# Patient Record
Sex: Male | Born: 1940 | Race: White | Hispanic: No | Marital: Married | Smoking: Former smoker
Health system: Southern US, Community
[De-identification: ages and names within clinical notes are randomized; demographics above are authoritative.]

## PROBLEM LIST (undated history)

## (undated) DIAGNOSIS — R0602 Shortness of breath: Secondary | ICD-10-CM

## (undated) DIAGNOSIS — K219 Gastro-esophageal reflux disease without esophagitis: Secondary | ICD-10-CM

## (undated) DIAGNOSIS — N289 Disorder of kidney and ureter, unspecified: Secondary | ICD-10-CM

## (undated) DIAGNOSIS — G4730 Sleep apnea, unspecified: Secondary | ICD-10-CM

## (undated) DIAGNOSIS — E7800 Pure hypercholesterolemia, unspecified: Secondary | ICD-10-CM

## (undated) DIAGNOSIS — R011 Cardiac murmur, unspecified: Secondary | ICD-10-CM

## (undated) DIAGNOSIS — J189 Pneumonia, unspecified organism: Secondary | ICD-10-CM

## (undated) DIAGNOSIS — I10 Essential (primary) hypertension: Secondary | ICD-10-CM

## (undated) DIAGNOSIS — I509 Heart failure, unspecified: Secondary | ICD-10-CM

## (undated) DIAGNOSIS — M109 Gout, unspecified: Secondary | ICD-10-CM

## (undated) DIAGNOSIS — E119 Type 2 diabetes mellitus without complications: Secondary | ICD-10-CM

## (undated) HISTORY — PX: NEPHRECTOMY TRANSPLANTED ORGAN: SUR880

---

## 1989-05-03 HISTORY — PX: SHOULDER OPEN ROTATOR CUFF REPAIR: SHX2407

## 1989-05-03 HISTORY — PX: SHOULDER ARTHROSCOPY W/ ROTATOR CUFF REPAIR: SHX2400

## 2006-02-25 ENCOUNTER — Ambulatory Visit: Payer: Self-pay | Admitting: Internal Medicine

## 2006-03-27 ENCOUNTER — Ambulatory Visit: Payer: Self-pay | Admitting: Internal Medicine

## 2006-04-03 ENCOUNTER — Ambulatory Visit: Payer: Self-pay | Admitting: Internal Medicine

## 2009-03-08 ENCOUNTER — Encounter (INDEPENDENT_AMBULATORY_CARE_PROVIDER_SITE_OTHER): Payer: Self-pay | Admitting: *Deleted

## 2010-03-02 HISTORY — PX: AV FISTULA PLACEMENT: SHX1204

## 2010-05-14 HISTORY — PX: KIDNEY TRANSPLANT: SHX239

## 2011-09-03 DIAGNOSIS — J189 Pneumonia, unspecified organism: Secondary | ICD-10-CM

## 2011-09-03 HISTORY — DX: Pneumonia, unspecified organism: J18.9

## 2012-09-02 HISTORY — PX: CATARACT EXTRACTION W/ INTRAOCULAR LENS  IMPLANT, BILATERAL: SHX1307

## 2013-05-16 ENCOUNTER — Emergency Department (HOSPITAL_COMMUNITY): Payer: Medicare Other

## 2013-05-16 ENCOUNTER — Inpatient Hospital Stay (HOSPITAL_COMMUNITY)
Admission: EM | Admit: 2013-05-16 | Discharge: 2013-05-18 | DRG: 292 | Disposition: A | Discharge status: Home / Self Care | Payer: Medicare Other | Attending: Internal Medicine | Admitting: Internal Medicine

## 2013-05-16 ENCOUNTER — Encounter (HOSPITAL_COMMUNITY): Payer: Self-pay | Admitting: *Deleted

## 2013-05-16 DIAGNOSIS — I110 Hypertensive heart disease with heart failure: Secondary | ICD-10-CM

## 2013-05-16 DIAGNOSIS — R053 Chronic cough: Secondary | ICD-10-CM

## 2013-05-16 DIAGNOSIS — I5030 Unspecified diastolic (congestive) heart failure: Secondary | ICD-10-CM

## 2013-05-16 DIAGNOSIS — I5021 Acute systolic (congestive) heart failure: Secondary | ICD-10-CM

## 2013-05-16 DIAGNOSIS — I440 Atrioventricular block, first degree: Secondary | ICD-10-CM | POA: Diagnosis present

## 2013-05-16 DIAGNOSIS — K219 Gastro-esophageal reflux disease without esophagitis: Secondary | ICD-10-CM

## 2013-05-16 DIAGNOSIS — IMO0002 Reserved for concepts with insufficient information to code with codable children: Secondary | ICD-10-CM

## 2013-05-16 DIAGNOSIS — E119 Type 2 diabetes mellitus without complications: Secondary | ICD-10-CM

## 2013-05-16 DIAGNOSIS — I5033 Acute on chronic diastolic (congestive) heart failure: Principal | ICD-10-CM | POA: Diagnosis present

## 2013-05-16 DIAGNOSIS — Z6829 Body mass index (BMI) 29.0-29.9, adult: Secondary | ICD-10-CM

## 2013-05-16 DIAGNOSIS — I509 Heart failure, unspecified: Secondary | ICD-10-CM

## 2013-05-16 DIAGNOSIS — E785 Hyperlipidemia, unspecified: Secondary | ICD-10-CM

## 2013-05-16 DIAGNOSIS — Z7982 Long term (current) use of aspirin: Secondary | ICD-10-CM

## 2013-05-16 DIAGNOSIS — N400 Enlarged prostate without lower urinary tract symptoms: Secondary | ICD-10-CM

## 2013-05-16 DIAGNOSIS — R05 Cough: Secondary | ICD-10-CM

## 2013-05-16 DIAGNOSIS — Z87891 Personal history of nicotine dependence: Secondary | ICD-10-CM

## 2013-05-16 DIAGNOSIS — I5031 Acute diastolic (congestive) heart failure: Secondary | ICD-10-CM

## 2013-05-16 DIAGNOSIS — I10 Essential (primary) hypertension: Secondary | ICD-10-CM

## 2013-05-16 DIAGNOSIS — N058 Unspecified nephritic syndrome with other morphologic changes: Secondary | ICD-10-CM | POA: Diagnosis present

## 2013-05-16 DIAGNOSIS — Z23 Encounter for immunization: Secondary | ICD-10-CM

## 2013-05-16 DIAGNOSIS — J4489 Other specified chronic obstructive pulmonary disease: Secondary | ICD-10-CM | POA: Diagnosis present

## 2013-05-16 DIAGNOSIS — Z794 Long term (current) use of insulin: Secondary | ICD-10-CM

## 2013-05-16 DIAGNOSIS — J449 Chronic obstructive pulmonary disease, unspecified: Secondary | ICD-10-CM

## 2013-05-16 DIAGNOSIS — E1129 Type 2 diabetes mellitus with other diabetic kidney complication: Secondary | ICD-10-CM | POA: Diagnosis present

## 2013-05-16 DIAGNOSIS — E7800 Pure hypercholesterolemia, unspecified: Secondary | ICD-10-CM | POA: Diagnosis present

## 2013-05-16 DIAGNOSIS — E669 Obesity, unspecified: Secondary | ICD-10-CM | POA: Diagnosis present

## 2013-05-16 DIAGNOSIS — Z940 Kidney transplant status: Secondary | ICD-10-CM

## 2013-05-16 DIAGNOSIS — Z79899 Other long term (current) drug therapy: Secondary | ICD-10-CM

## 2013-05-16 HISTORY — DX: Essential (primary) hypertension: I10

## 2013-05-16 HISTORY — DX: Pure hypercholesterolemia, unspecified: E78.00

## 2013-05-16 HISTORY — DX: Disorder of kidney and ureter, unspecified: N28.9

## 2013-05-16 HISTORY — DX: Heart failure, unspecified: I50.9

## 2013-05-16 LAB — BASIC METABOLIC PANEL
CO2: 28 mEq/L (ref 19–32)
Calcium: 8.7 mg/dL (ref 8.4–10.5)
Creatinine, Ser: 1.46 mg/dL — ABNORMAL HIGH (ref 0.50–1.35)
GFR calc non Af Amer: 47 mL/min — ABNORMAL LOW (ref >90)
Glucose, Bld: 205 mg/dL — ABNORMAL HIGH (ref 70–99)
Sodium: 139 mEq/L (ref 135–145)

## 2013-05-16 LAB — CBC
HCT: 37.5 % — ABNORMAL LOW (ref 39.0–52.0)
Hemoglobin: 11.8 g/dL — ABNORMAL LOW (ref 13.0–17.0)
MCH: 29.8 pg (ref 26.0–34.0)
MCV: 95.4 fL (ref 78.0–100.0)
Platelets: 154 10*3/uL (ref 150–400)
RBC: 3.89 MIL/uL — ABNORMAL LOW (ref 4.22–5.81)
RDW: 13.6 % (ref 11.5–15.5)
WBC: 6.9 10*3/uL (ref 4.0–10.5)
WBC: 7.8 10*3/uL (ref 4.0–10.5)

## 2013-05-16 LAB — POCT I-STAT TROPONIN I: Troponin i, poc: 0.03 ng/mL (ref 0.00–0.08)

## 2013-05-16 LAB — GLUCOSE, CAPILLARY
Glucose-Capillary: 155 mg/dL — ABNORMAL HIGH (ref 70–99)
Glucose-Capillary: 162 mg/dL — ABNORMAL HIGH (ref 70–99)

## 2013-05-16 LAB — CREATININE, SERUM
GFR calc Af Amer: 56 mL/min — ABNORMAL LOW (ref >90)
GFR calc non Af Amer: 48 mL/min — ABNORMAL LOW (ref >90)

## 2013-05-16 LAB — LIPID PANEL: Total CHOL/HDL Ratio: 1.5 RATIO

## 2013-05-16 LAB — TSH: TSH: 0.882 u[IU]/mL (ref 0.350–4.500)

## 2013-05-16 LAB — PRO B NATRIURETIC PEPTIDE: Pro B Natriuretic peptide (BNP): 5098 pg/mL — ABNORMAL HIGH (ref 0–125)

## 2013-05-16 MED ORDER — INSULIN ASPART 100 UNIT/ML ~~LOC~~ SOLN
0.0000 [IU] | Freq: Three times a day (TID) | SUBCUTANEOUS | Status: DC
  Administered 2013-05-16: 3 [IU] via SUBCUTANEOUS
  Administered 2013-05-16 – 2013-05-17 (×2): 2 [IU] via SUBCUTANEOUS
  Administered 2013-05-17: 5 [IU] via SUBCUTANEOUS
  Administered 2013-05-18: 3 [IU] via SUBCUTANEOUS
  Administered 2013-05-18: 2 [IU] via SUBCUTANEOUS

## 2013-05-16 MED ORDER — IRBESARTAN 150 MG PO TABS
150.0000 mg | ORAL_TABLET | Freq: Every day | ORAL | Status: DC
  Administered 2013-05-16 – 2013-05-18 (×3): 150 mg via ORAL
  Filled 2013-05-16 (×3): qty 1

## 2013-05-16 MED ORDER — SODIUM CHLORIDE 0.9 % IV SOLN: 250.0000 mL | INTRAVENOUS | Status: DC | PRN

## 2013-05-16 MED ORDER — METOPROLOL TARTRATE 12.5 MG HALF TABLET
12.5000 mg | ORAL_TABLET | Freq: Two times a day (BID) | ORAL | Status: DC
  Administered 2013-05-16 – 2013-05-18 (×5): 12.5 mg via ORAL
  Filled 2013-05-16 (×6): qty 1

## 2013-05-16 MED ORDER — TACROLIMUS 1 MG PO CAPS
3.0000 mg | ORAL_CAPSULE | Freq: Two times a day (BID) | ORAL | Status: DC
  Administered 2013-05-16 – 2013-05-18 (×5): 3 mg via ORAL
  Filled 2013-05-16 (×7): qty 3

## 2013-05-16 MED ORDER — MORPHINE SULFATE 2 MG/ML IJ SOLN: 2.0000 mg | INTRAMUSCULAR | Status: DC | PRN

## 2013-05-16 MED ORDER — SODIUM CHLORIDE 0.9 % IJ SOLN: 3.0000 mL | INTRAMUSCULAR | Status: DC | PRN

## 2013-05-16 MED ORDER — PANTOPRAZOLE SODIUM 40 MG PO TBEC
40.0000 mg | DELAYED_RELEASE_TABLET | Freq: Every day | ORAL | Status: DC
  Administered 2013-05-16 – 2013-05-18 (×3): 40 mg via ORAL
  Filled 2013-05-16 (×3): qty 1

## 2013-05-16 MED ORDER — TERAZOSIN HCL 5 MG PO CAPS
10.0000 mg | ORAL_CAPSULE | Freq: Every day | ORAL | Status: DC
  Administered 2013-05-16 – 2013-05-18 (×3): 10 mg via ORAL
  Filled 2013-05-16 (×3): qty 2

## 2013-05-16 MED ORDER — ASPIRIN 81 MG PO CHEW
81.0000 mg | CHEWABLE_TABLET | ORAL | Status: DC
  Administered 2013-05-17: 81 mg via ORAL
  Filled 2013-05-16 (×2): qty 1

## 2013-05-16 MED ORDER — CALCIUM CARBONATE-VITAMIN D 500-200 MG-UNIT PO TABS
1.0000 | ORAL_TABLET | Freq: Two times a day (BID) | ORAL | Status: DC
  Administered 2013-05-16 – 2013-05-18 (×4): 1 via ORAL
  Filled 2013-05-16 (×5): qty 1

## 2013-05-16 MED ORDER — HEPARIN SODIUM (PORCINE) 5000 UNIT/ML IJ SOLN
5000.0000 [IU] | Freq: Three times a day (TID) | INTRAMUSCULAR | Status: DC
  Administered 2013-05-16 – 2013-05-18 (×6): 5000 [IU] via SUBCUTANEOUS
  Filled 2013-05-16 (×9): qty 1

## 2013-05-16 MED ORDER — CALCIUM CARBONATE-VITAMIN D 600-400 MG-UNIT PO TABS: 1.0000 | ORAL_TABLET | Freq: Two times a day (BID) | ORAL | Status: DC

## 2013-05-16 MED ORDER — SODIUM CHLORIDE 0.9 % IJ SOLN
3.0000 mL | Freq: Two times a day (BID) | INTRAMUSCULAR | Status: DC
Start: 2013-05-16 — End: 2013-05-18
  Administered 2013-05-17 – 2013-05-18 (×2): 3 mL via INTRAVENOUS

## 2013-05-16 MED ORDER — ACETAMINOPHEN 650 MG RE SUPP: 650.0000 mg | Freq: Four times a day (QID) | RECTAL | Status: DC | PRN

## 2013-05-16 MED ORDER — HYDRALAZINE HCL 20 MG/ML IJ SOLN
10.0000 mg | Freq: Four times a day (QID) | INTRAMUSCULAR | Status: DC | PRN
  Administered 2013-05-18: 10 mg via INTRAVENOUS
  Filled 2013-05-16: qty 1

## 2013-05-16 MED ORDER — SODIUM CHLORIDE 0.9 % IJ SOLN
3.0000 mL | Freq: Two times a day (BID) | INTRAMUSCULAR | Status: DC
Start: 2013-05-16 — End: 2013-05-18
  Administered 2013-05-16 – 2013-05-17 (×2): 3 mL via INTRAVENOUS

## 2013-05-16 MED ORDER — SIMVASTATIN 20 MG PO TABS
20.0000 mg | ORAL_TABLET | Freq: Every evening | ORAL | Status: DC
  Administered 2013-05-16 – 2013-05-17 (×2): 20 mg via ORAL
  Filled 2013-05-16 (×3): qty 1

## 2013-05-16 MED ORDER — FUROSEMIDE 10 MG/ML IJ SOLN
40.0000 mg | Freq: Two times a day (BID) | INTRAMUSCULAR | Status: DC
  Administered 2013-05-16 (×2): 40 mg via INTRAVENOUS
  Filled 2013-05-16 (×5): qty 4

## 2013-05-16 MED ORDER — PREDNISONE 5 MG PO TABS
5.0000 mg | ORAL_TABLET | Freq: Every day | ORAL | Status: DC
  Administered 2013-05-16 – 2013-05-18 (×3): 5 mg via ORAL
  Filled 2013-05-16 (×3): qty 1

## 2013-05-16 MED ORDER — INFLUENZA VAC SPLIT QUAD 0.5 ML IM SUSP
0.5000 mL | INTRAMUSCULAR | Status: AC
  Administered 2013-05-17: 0.5 mL via INTRAMUSCULAR
  Filled 2013-05-16: qty 0.5

## 2013-05-16 MED ORDER — MYCOPHENOLATE MOFETIL 250 MG PO CAPS
500.0000 mg | ORAL_CAPSULE | Freq: Two times a day (BID) | ORAL | Status: DC
  Administered 2013-05-16 – 2013-05-18 (×5): 500 mg via ORAL
  Filled 2013-05-16 (×6): qty 2

## 2013-05-16 MED ORDER — FUROSEMIDE 10 MG/ML IJ SOLN
20.0000 mg | Freq: Once | INTRAMUSCULAR | Status: AC
  Administered 2013-05-16: 20 mg via INTRAVENOUS
  Filled 2013-05-16: qty 2

## 2013-05-16 MED ORDER — PNEUMOCOCCAL VAC POLYVALENT 25 MCG/0.5ML IJ INJ
0.5000 mL | INJECTION | INTRAMUSCULAR | Status: AC
  Administered 2013-05-17: 0.5 mL via INTRAMUSCULAR
  Filled 2013-05-16: qty 0.5

## 2013-05-16 MED ORDER — ONDANSETRON HCL 4 MG PO TABS: 4.0000 mg | ORAL_TABLET | Freq: Four times a day (QID) | ORAL | Status: DC | PRN

## 2013-05-16 MED ORDER — FINASTERIDE 5 MG PO TABS
5.0000 mg | ORAL_TABLET | Freq: Every day | ORAL | Status: DC
  Administered 2013-05-16 – 2013-05-18 (×3): 5 mg via ORAL
  Filled 2013-05-16 (×3): qty 1

## 2013-05-16 MED ORDER — ALBUTEROL SULFATE (5 MG/ML) 0.5% IN NEBU
2.5000 mg | INHALATION_SOLUTION | RESPIRATORY_TRACT | Status: DC | PRN
  Administered 2013-05-16 – 2013-05-17 (×2): 2.5 mg via RESPIRATORY_TRACT
  Filled 2013-05-16 (×2): qty 0.5

## 2013-05-16 MED ORDER — ONDANSETRON HCL 4 MG/2ML IJ SOLN: 4.0000 mg | Freq: Four times a day (QID) | INTRAMUSCULAR | Status: DC | PRN

## 2013-05-16 MED ORDER — VITAMIN B-12 1000 MCG PO TABS
1000.0000 ug | ORAL_TABLET | Freq: Every day | ORAL | Status: DC
  Administered 2013-05-16 – 2013-05-18 (×3): 1000 ug via ORAL
  Filled 2013-05-16 (×3): qty 1

## 2013-05-16 MED ORDER — INSULIN ASPART 100 UNIT/ML ~~LOC~~ SOLN
4.0000 [IU] | Freq: Three times a day (TID) | SUBCUTANEOUS | Status: DC
  Administered 2013-05-16 – 2013-05-18 (×6): 4 [IU] via SUBCUTANEOUS

## 2013-05-16 MED ORDER — ACETAMINOPHEN 325 MG PO TABS: 650.0000 mg | ORAL_TABLET | Freq: Four times a day (QID) | ORAL | Status: DC | PRN

## 2013-05-16 MED ORDER — INSULIN NPH (HUMAN) (ISOPHANE) 100 UNIT/ML ~~LOC~~ SUSP
15.0000 [IU] | Freq: Every day | SUBCUTANEOUS | Status: DC
  Administered 2013-05-16: 15 [IU] via SUBCUTANEOUS
  Filled 2013-05-16: qty 10

## 2013-05-16 MED ORDER — GABAPENTIN 300 MG PO CAPS
300.0000 mg | ORAL_CAPSULE | Freq: Every evening | ORAL | Status: DC
  Administered 2013-05-16 – 2013-05-17 (×2): 300 mg via ORAL
  Filled 2013-05-16 (×3): qty 1

## 2013-05-16 MED ORDER — OXYCODONE HCL 5 MG PO TABS: 5.0000 mg | ORAL_TABLET | ORAL | Status: DC | PRN

## 2013-05-16 NOTE — ED Provider Notes (Addendum)
CSN: [id number]     Arrival date & time 05/16/13  0432 History   First MD Initiated Contact with Patient 05/16/13 0515     Chief Complaint  Patient presents with  . Shortness of Breath   (Consider location/radiation/quality/duration/timing/severity/associated sxs/prior Treatment) HPI Comments: 72 yo wm with cc of SOB. Symptoms worsened this AM at 0130.  Pt has been seen at [facility] for cardiac issues.  He is s/p kidney transplant 3 yrs ago.  Pt is seen at [facility] and [place].  Kidney function is stable per his wife.  BP management has been difficult per wife.    Pt has not had recent fever/chills.  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    EMS provided a breathing treatment and symptoms improved.    Pt has a home nebulizer, but is not on Oxygen.    Patient is a 72 y.o. male presenting with shortness of breath. The history is provided by the patient and the spouse.  Shortness of Breath Severity:  Moderate Onset quality:  Sudden Duration:  2 hours Timing:  Intermittent Progression:  Worsening Chronicity:  Recurrent Context: not activity, not known allergens, not occupational exposure, not strong odors, not URI and not weather changes   Exacerbated by: lying flat. Associated symptoms: cough   Associated symptoms: no chest pain, no diaphoresis, no fever and no wheezing     Past Medical History  Diagnosis Date  . Hypertension   . Diabetes mellitus without complication   . CHF (congestive heart failure)   . Hypercholesteremia   . Renal disorder    Past Surgical History  Procedure Laterality Date  . Kidney transplant Right 05/14/2010  . Av fistula placement Left July 2011  . Nephrectomy transplanted organ    . Hernia repair    . Rotator cuff repair     History reviewed. No pertinent family history. History  Substance Use Topics  . Smoking status: Former Games developer  . Smokeless tobacco: Never Used  . Alcohol Use: No    Review of Systems   Constitutional: Positive for activity change and fatigue. Negative for fever, chills, diaphoresis, appetite change and unexpected weight change.  Respiratory: Positive for cough, chest tightness and shortness of breath. Negative for apnea, choking, wheezing and stridor.   Cardiovascular: Positive for leg swelling. Negative for chest pain and palpitations.  Endocrine: Negative.   Genitourinary: Positive for enuresis. Negative for frequency, flank pain, penile swelling, scrotal swelling and testicular pain.       History of kidney transplant  Allergic/Immunologic: Negative.   Neurological: Negative.   Hematological: Negative.     Allergies  Review of patient's allergies indicates no known allergies.  Home Medications   Current Outpatient Rx  Name  Route  Sig  Dispense  Refill  . aspirin 81 MG tablet   Oral   Take 81 mg by mouth every other day.         . Calcium Carbonate-Vitamin D (CALTRATE 600+D PO)   Oral   Take 1 tablet by mouth 2 (two) times daily.         . chlorthalidone (HYGROTON) 25 MG tablet   Oral   Take 12.5 mg by mouth daily.         . cyanocobalamin 1000 MCG tablet   Oral   Take 1,000 mcg by mouth daily.         . finasteride (PROSCAR) 5 MG tablet   Oral  Take 5 mg by mouth daily.         Marland Kitchen gabapentin (NEURONTIN) 300 MG capsule   Oral   Take 300 mg by mouth every evening.          Marland Kitchen lisinopril (PRINIVIL,ZESTRIL) 10 MG tablet   Oral   Take 5 mg by mouth 2 (two) times daily.          . metoprolol tartrate (LOPRESSOR) 25 MG tablet   Oral   Take 12.5 mg by mouth 2 (two) times daily.         . mycophenolate (CELLCEPT) 250 MG capsule   Oral   Take 500 mg by mouth 2 (two) times daily.          Marland Kitchen omeprazole (PRILOSEC) 20 MG capsule   Oral   Take 20 mg by mouth daily.         . predniSONE (DELTASONE) 5 MG tablet   Oral   Take 5 mg by mouth daily.         . simvastatin (ZOCOR) 40 MG tablet   Oral   Take 20 mg by mouth every  evening.         . tacrolimus (PROGRAF) 1 MG capsule   Oral   Take 3 mg by mouth 2 (two) times daily.          Marland Kitchen terazosin (HYTRIN) 10 MG capsule   Oral   Take 10 mg by mouth daily.           BP 176/75  Pulse 83  Temp(Src) 98.5 F (36.9 C) (Oral)  Resp 17  SpO2 98% Physical Exam  Nursing note and vitals reviewed. Constitutional: He is oriented to person, place, and time. He appears well-developed. No distress.  HENT:  Head: Normocephalic and atraumatic.  Eyes: Conjunctivae are normal.  Neck: Normal range of motion. Neck supple. No JVD present.  Cardiovascular: Normal rate, regular rhythm and intact distal pulses.   Pulmonary/Chest: Effort normal. No stridor. He has wheezes. He has no rales. He exhibits no tenderness.  Abdominal: Soft. He exhibits no distension. There is no tenderness. There is no rebound and no guarding.  Musculoskeletal: He exhibits edema.  Neurological: He is alert and oriented to person, place, and time.  Skin: Skin is warm.  Psychiatric: He has a normal mood and affect. His behavior is normal.    ED Course  Procedures (including critical care time) Labs Review Labs Reviewed  PRO B NATRIURETIC PEPTIDE - Abnormal; Notable for the following:    Pro B Natriuretic peptide (BNP) 5098.0 (*)    All other components within normal limits  BASIC METABOLIC PANEL - Abnormal; Notable for the following:    Glucose, Bld 205 (*)    BUN 24 (*)    Creatinine, Ser 1.46 (*)    GFR calc non Af Amer 47 (*)    GFR calc Af Amer 54 (*)    All other components within normal limits  CBC - Abnormal; Notable for the following:    RBC 3.89 (*)    Hemoglobin 11.6 (*)    HCT 37.1 (*)    All other components within normal limits  POCT I-STAT TROPONIN I   Imaging Review Dg Chest 2 View  05/16/2013   CLINICAL DATA:  Shortness of Breath.  EXAM: CHEST  2 VIEW  COMPARISON:  12/29/2012.  FINDINGS: Diffuse interstitial opacities with Kerley B-lines and fissural thickening.  Pulmonary hyperinflation. Previously seen focal opacity at the right base has largely  resolved. Small pleural effusions. No cardiomegaly. Aortic atherosclerosis.  IMPRESSION: 1. CHF. 2. COPD.   Electronically Signed   By: Tiburcio Pea   On: 05/16/2013 05:52    Results for orders placed during the hospital encounter of 05/16/13  PRO B NATRIURETIC PEPTIDE      Result Value Range   Pro B Natriuretic peptide (BNP) 5098.0 (*) 0 - 125 pg/mL  BASIC METABOLIC PANEL      Result Value Range   Sodium 139  135 - 145 mEq/L   Potassium 4.4  3.5 - 5.1 mEq/L   Chloride 101  96 - 112 mEq/L   CO2 28  19 - 32 mEq/L   Glucose, Bld 205 (*) 70 - 99 mg/dL   BUN 24 (*) 6 - 23 mg/dL   Creatinine, Ser 1.61 (*) 0.50 - 1.35 mg/dL   Calcium 8.7  8.4 - 09.6 mg/dL   GFR calc non Af Amer 47 (*) >90 mL/min   GFR calc Af Amer 54 (*) >90 mL/min  CBC      Result Value Range   WBC 6.9  4.0 - 10.5 K/uL   RBC 3.89 (*) 4.22 - 5.81 MIL/uL   Hemoglobin 11.6 (*) 13.0 - 17.0 g/dL   HCT 04.5 (*) 40.9 - 81.1 %   MCV 95.4  78.0 - 100.0 fL   MCH 29.8  26.0 - 34.0 pg   MCHC 31.3  30.0 - 36.0 g/dL   RDW 91.4  78.2 - 95.6 %   Platelets 154  150 - 400 K/uL  POCT I-STAT TROPONIN I      Result Value Range   Troponin i, poc 0.03  0.00 - 0.08 ng/mL   Comment 3             Results for orders placed during the hospital encounter of 05/16/13  PRO B NATRIURETIC PEPTIDE      Result Value Range   Pro B Natriuretic peptide (BNP) 5098.0 (*) 0 - 125 pg/mL  BASIC METABOLIC PANEL      Result Value Range   Sodium 139  135 - 145 mEq/L   Potassium 4.4  3.5 - 5.1 mEq/L   Chloride 101  96 - 112 mEq/L   CO2 28  19 - 32 mEq/L   Glucose, Bld 205 (*) 70 - 99 mg/dL   BUN 24 (*) 6 - 23 mg/dL   Creatinine, Ser 2.13 (*) 0.50 - 1.35 mg/dL   Calcium 8.7  8.4 - 08.6 mg/dL   GFR calc non Af Amer 47 (*) >90 mL/min   GFR calc Af Amer 54 (*) >90 mL/min  CBC      Result Value Range   WBC 6.9  4.0 - 10.5 K/uL   RBC 3.89 (*) 4.22 - 5.81 MIL/uL    Hemoglobin 11.6 (*) 13.0 - 17.0 g/dL   HCT 57.8 (*) 46.9 - 62.9 %   MCV 95.4  78.0 - 100.0 fL   MCH 29.8  26.0 - 34.0 pg   MCHC 31.3  30.0 - 36.0 g/dL   RDW 52.8  41.3 - 24.4 %   Platelets 154  150 - 400 K/uL  POCT I-STAT TROPONIN I      Result Value Range   Troponin i, poc 0.03  0.00 - 0.08 ng/mL   Comment 3               Date: 05/16/2013  Rate: 85  Rhythm: normal sinus rhythm  QRS Axis: normal  Intervals: PR  prolonged  ST/T Wave abnormalities: nonspecific ST changes  Conduction Disutrbances:none  Narrative Interpretation:   Old EKG Reviewed: none available    MDM   1. CHF (congestive heart failure)   2. History of renal transplant   3. COPD (chronic obstructive pulmonary disease)    72 year old white male presents emergency department with chief complaint of shortness of breath.  Patient has been having episodes of shortness of worse when laying flat in the evenings. His symptoms improved when he is upright. Patient has multiple medical problems to include history of kidney transplant, CHF, COPD, and he is currently being evaluated at [facility] and [facility] by cardiology.  Patient's presentation is consistent with CHF exacerbation. His EKG does not show signs of ischemia, troponin is negative, his BNP is 5098. At 6:20 AM patient is sitting up, in no acute distress on O2 via [place] @ 2L, his vital signs are normal with the exception of elevated blood pressure systolic in the 160s, and he states he feels fine.     Based on past medical history, comorbidities, and patient and wife's preferences patient will be admitted to inpatient service.  He will be given a gentle dose of Lasix 20 mg. Further inpatient management in coordination of patient medications can occur during this inpatient stay. A consult with Dr. Brien Few who agreed with admission.  Darlys Gales, MD 05/16/13 [id number]  Darlys Gales, MD 05/16/13 [id number]  Darlys Gales, MD 05/27/13 [phone]

## 2013-05-16 NOTE — ED Notes (Signed)
Per EMS: pt coming from home with c/o shortness of breath since 3 am. Pt was awaken from sleep. Pt is ST on monitor, EKG unremarkable. 2.5 albuterol given en route. 190/110, 30 bpm, unable to tolerate lying flat, shortness of breat increases with exertion. Skin warm and dry, pt is A&Ox4.

## 2013-05-16 NOTE — H&P (Signed)
PCP:   Marin Comment, FNP   Chief Complaint:  Shortness of breath.  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 Patient was treated with Valcyte for CMV reactivation, per [facility] notes, but appear to have cleared the virus in 01/2013. He did have pneumonia a few months back, and was prescribed Albuterol nebs for prn use. He has had a dry intermittent cough for the last 3 months. According to patient, and spouse, who was at the bedside, Patient was feeling quite well all day on 05/14/13. On 05/15/13, he awoke at about 5:00 AM-6:00 AM, with SOB and chest tightness, which resolved after he utilized his Albuterol. He felt fine thereafter, all day, apart from a bout of abdominal discomfort at about 7:00 PM, after his meal, unassociated with vomiting or diarrhea. This quickly resolved, and he was completely asymptomatic when he went to bed att about 9:00 PM. At 3:30 AM on 05/16/13, patient awoke with marked SOB, without chest pain, nausea or diaphoresis. Spouse called EMS, he was administered bronchodilator nebulizers, and brought to the ED, where CXR revealed CHF/COPD. Patient was administered a single iv dose of Lasix in the ED, and referred for admission. He has no previous known history of CAD or cardiomyopathy.    Allergies:  No Known Allergies    Past Medical History  Diagnosis Date  . Hypertension   . Diabetes mellitus without complication   . CHF (congestive heart failure)   . Hypercholesteremia   . Renal disorder     Past Surgical History  Procedure Laterality Date  . Kidney transplant Right 05/14/2010  . Av fistula placement Left July 2011  . Nephrectomy  transplanted organ    . Hernia repair    . Rotator cuff repair      Prior to Admission medications   Medication Sig Start Date End Date Taking? Authorizing Provider  aspirin 81 MG tablet Take 81 mg by mouth every other day.   Yes Historical Provider, MD  Calcium Carbonate-Vitamin D (CALTRATE 600+D PO) Take 1 tablet by mouth 2 (two) times daily.   Yes Historical Provider, MD  chlorthalidone (HYGROTON) 25 MG tablet Take 12.5 mg by mouth daily.   Yes Historical Provider, MD  cyanocobalamin 1000 MCG tablet Take 1,000 mcg by mouth daily.   Yes Historical Provider, MD  finasteride (PROSCAR) 5 MG tablet Take 5 mg by mouth daily.   Yes Historical Provider, MD  gabapentin (NEURONTIN) 300 MG capsule Take 300 mg by mouth every evening.    Yes Historical Provider, MD  lisinopril (PRINIVIL,ZESTRIL) 10 MG tablet Take 5 mg by mouth 2 (two) times daily.    Yes Historical Provider, MD  metoprolol tartrate (LOPRESSOR) 25 MG tablet Take 12.5 mg by mouth 2 (two) times daily.   Yes Historical Provider, MD  mycophenolate (CELLCEPT) 250 MG capsule Take 500 mg by mouth 2 (two) times daily.    Yes Historical Provider, MD  omeprazole (PRILOSEC) 20 MG capsule Take 20 mg by mouth daily.   Yes Historical Provider, MD  predniSONE (DELTASONE) 5 MG tablet Take 5 mg by mouth daily.   Yes Historical Provider, MD  simvastatin (ZOCOR) 40 MG tablet Take 20 mg by mouth every evening.   Yes Historical  Provider, MD  tacrolimus (PROGRAF) 1 MG capsule Take 3 mg by mouth 2 (two) times daily.    Yes Historical Provider, MD  terazosin (HYTRIN) 10 MG capsule Take 10 mg by mouth daily.    Yes Historical Provider, MD    Social History: Patient reports that he has quit smoking. He has never used smokeless tobacco. He reports that he does not drink alcohol or use illicit drugs.  History reviewed. No pertinent family history.  Review of Systems:  As per HPI and chief complaint. Patent denies fatigue, diminished appetite, weight loss,  fever, chills, headache, blurred vision, difficulty in speaking, dysphagia, chest pain, paroxysmal nocturnal dyspnea, nausea, diaphoresis, vomiting, diarrhea, hematemesis, melena, dysuria, nocturia, urinary frequency, hematochezia, lower extremity swelling, pain, or redness. The rest of the systems review is negative.  Physical Exam:  General:  Patient does not appear to be in obvious acute distress. Alert, communicative, fully oriented, talking in complete sentences, not short of breath at rest, although mildly short of breath on moderate exertion.   HEENT:  Mild clinical pallor, no jaundice, no conjunctival injection or discharge. NECK:  Supple, JVP not seen, no carotid bruits, no palpable lymphadenopathy, no palpable goiter. CHEST:  Clinically clear to auscultation, no wheezes, no crackles. HEART:  Sounds 1 and 2 heard, normal, regular, no murmurs. ABDOMEN:  Moderately obese, soft, non-tender, no palpable organomegaly, no palpable masses, normal bowel sounds. GENITALIA:  Not examined. LOWER EXTREMITIES:  Minimal pitting edema, palpable peripheral pulses. MUSCULOSKELETAL SYSTEM:  Generalized osteoarthritic changes, otherwise, normal. CENTRAL NERVOUS SYSTEM:  No focal neurologic deficit on gross examination.  Labs on Admission:  Results for orders placed during the hospital encounter of 05/16/13 (from the past 48 hour(s))  PRO B NATRIURETIC PEPTIDE     Status: Abnormal   Collection Time    05/16/13  5:08 AM      Result Value Range   Pro B Natriuretic peptide (BNP) 5098.0 (*) 0 - 125 pg/mL  BASIC METABOLIC PANEL     Status: Abnormal   Collection Time    05/16/13  5:08 AM      Result Value Range   Sodium 139  135 - 145 mEq/L   Potassium 4.4  3.5 - 5.1 mEq/L   Chloride 101  96 - 112 mEq/L   CO2 28  19 - 32 mEq/L   Glucose, Bld 205 (*) 70 - 99 mg/dL   BUN 24 (*) 6 - 23 mg/dL   Creatinine, Ser 1.61 (*) 0.50 - 1.35 mg/dL   Calcium 8.7  8.4 - 09.6 mg/dL   GFR calc non Af Amer 47 (*) >90  mL/min   GFR calc Af Amer 54 (*) >90 mL/min   Comment: (NOTE)     The eGFR has been calculated using the CKD EPI equation.     This calculation has not been validated in all clinical situations.     eGFR's persistently <90 mL/min signify possible Chronic Kidney     Disease.  CBC     Status: Abnormal   Collection Time    05/16/13  5:08 AM      Result Value Range   WBC 6.9  4.0 - 10.5 K/uL   RBC 3.89 (*) 4.22 - 5.81 MIL/uL   Hemoglobin 11.6 (*) 13.0 - 17.0 g/dL   HCT 04.5 (*) 40.9 - 81.1 %   MCV 95.4  78.0 - 100.0 fL   MCH 29.8  26.0 - 34.0 pg   MCHC 31.3  30.0 - 36.0 g/dL  RDW 13.6  11.5 - 15.5 %   Platelets 154  150 - 400 K/uL  POCT I-STAT TROPONIN I     Status: None   Collection Time    05/16/13  5:27 AM      Result Value Range   Troponin i, poc 0.03  0.00 - 0.08 ng/mL   Comment 3            Comment: Due to the release kinetics of cTnI,     a negative result within the first hours     of the onset of symptoms does not rule out     myocardial infarction with certainty.     If myocardial infarction is still suspected,     repeat the test at appropriate intervals.    Radiological Exams on Admission: Dg Chest 2 View  05/16/2013   CLINICAL DATA:  Shortness of Breath.  EXAM: CHEST  2 VIEW  COMPARISON:  12/29/2012.  FINDINGS: Diffuse interstitial opacities with Kerley B-lines and fissural thickening. Pulmonary hyperinflation. Previously seen focal opacity at the right base has largely resolved. Small pleural effusions. No cardiomegaly. Aortic atherosclerosis.  IMPRESSION: 1. CHF. 2. COPD.   Electronically Signed   By: Tiburcio Pea   On: 05/16/2013 05:52    Assessment/Plan Active Problems:    1. CHF (congestive heart failure): Patient presented with sudden onset acute SOB, without chest pain, fever or productive cough. CXR revealed diffuse interstitial opacities with Kerley B-lines and fissural thickening, consistent with CHF decompensation. ProBNP was elevated at 5098. He  also did have mild bilateral LE edema. Patient has no previous history of CAD or cardiomyopathy, so this is a new diagnosis, and etiology is unclear. 12-Lead EKG shows 1st degree heart block, old q-waves in V1 and V2, as well as poor R-Wave progression, but no acute ischemic changes. Initial cardiac enzymes are unelevated. Patient will be admitted for telemetric monitoring, we shall complete cycling cardiac enzymes, and shall manage with iv Diuretic. 2D Echocardiogram will be arranged, and cardiology consultation, requested.  2. HTN (hypertension): BP is currently markedly elevated at 176/75-193/72, and this may be the culprit for CHF decompensation,. According to patient's [facility] records, Amlodipine was discontinued a couple of months ago, for borderline low BP, and according to patient's spouse, BP has remained uncontrolled, ever since. We shall adjust antihypertensive medication, and attempt to optimize BP. 3. Diabetes mellitus: This is insulin requiring type-2, and apart from an episode of hypoglycemia about a week ago, patient claims to have had good control. Random blood glucose is 205 at this time. We shall check HBA1C, but manage with SSI, meal cover and scheduled NPH, to approximate patient's home regimen. Adjusted as indicated. (Home regimen: Reg insulin at meal times-13units/13units/13units; NPH-17units QHS). 4. Dyslipidemia: On statin, which will be continued. Check lipid profile.  5. Chronic cough: This has been going on for about 3 months, and is non-productive. The culprit is likely to be patient's ACE-i. We shall substitute with an ARB.  6. History of renal transplantation: Patent has a history of ESRD, secondary to diabetic nephropathy, s/p living donor kidney transplant 05/14/2010 and follows up at [facility] transplant clinic. Creatinine is 1.46, and is stable, given a known baseline creatinine of 1.8 on 04/27/13. Following renal indices.   7. GERD (gastroesophageal reflux disease): Asymptomatic on  PPI.  8. BPH (benign prostatic hyperplasia): Not problematic.   Further management will depend on clinical course.   Comment: Patient is FULL CODE.    Time Spent on Admission:  1 Hour.   Nishan Ovens,CHRISTOPHER 05/16/2013, 8:26 AM

## 2013-05-16 NOTE — Consult Note (Signed)
CARDIOLOGY CONSULT NOTE  Patient ID: Jon Burns MRN: [id number] DOB/AGE: 70-Feb-1942 70 y.o.  Admit date: 05/16/2013  Reason for Consultation: heart failure  HPI: Jon Burns is a 72 y.o. 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 In about April of this year, he had been on at least 2-3 antihypertensive medications along with Lasix, but as he apparently became fatigued and volume depleted, his Lasix was reduced to 20 mg daily and his amlodipine was discontinued. On the morning of 05/15/13, he had some SOB which was relieved with a nebulized albuterol treatment. He denied any associated chest pain or diaphoresis. This morning at approximately 3 AM, he again felt significantly short of breath but denied concomitant chest pain, diaphoresis, and palpitations. He was brought to the [facility] ED and as per the patient, his wife, and the medical record, after he was given 1 dose of IV Lasix 20 mg, his symptoms resolved. During my evaluation, he reports "I feel like a million bucks".  He has been checking his BP at home recently, 3-4 times daily, and the SBP readings have been consistently in the 160 mmHg range or greater, with diastolic readings in the 60-70 mmHg range. According to his wife, he eats both sodium-rich foods and processed foods including canned roast beef, albeit the patient minimizes the quantity and frequency. At present, he is free of chest pain, SOB, diaphoresis, and leg swelling.   An echocardiogram performed at [facility] on 02-02-2013 showed normal LV systolic function, EF>55%, with severe concentric LVH and mild LAE (no comment on diastolic function or LVEDP). His stress test was submaximal in that he did not reach an adequate heart rate, although no inducible wall motion abnormalities were noted.  FamHx: non-contributory to this  admission  SocHx: former smoker, quit 15 yrs ago, used to smoke 3 ppd x 40 yrs.  No Known Allergies  Current Facility-Administered Medications  Medication Dose Route Frequency Provider Last Rate Last Dose  . 0.9 %  sodium chloride infusion  250 mL Intravenous PRN Laveda Norman, MD      . acetaminophen (TYLENOL) tablet 650 mg  650 mg Oral Q6H PRN Laveda Norman, MD       Or  . acetaminophen (TYLENOL) suppository 650 mg  650 mg Rectal Q6H PRN Laveda Norman, MD      . albuterol (PROVENTIL) (5 MG/ML) 0.5% nebulizer solution 2.5 mg  2.5 mg Nebulization Q2H PRN Laveda Norman, MD      . Melene Muller ON 05/17/2013] aspirin chewable tablet 81 mg  81 mg Oral QODAY Laveda Norman, MD      . calcium-vitamin D (OSCAL WITH D) 500-200 MG-UNIT per tablet 1 tablet  1 tablet Oral BID Laveda Norman, MD      . finasteride (PROSCAR) tablet 5 mg  5 mg Oral Daily Laveda Norman, MD   5 mg at 05/16/13 1213  . furosemide (LASIX) injection 40 mg  40 mg Intravenous BID Laveda Norman, MD   40 mg at 05/16/13 1038  . gabapentin (NEURONTIN) capsule 300 mg  300 mg Oral QPM Laveda Norman, MD      . heparin injection 5,000 Units  5,000 Units Subcutaneous Q8H Laveda Norman, MD   5,000 Units at 05/16/13 1216  . hydrALAZINE (APRESOLINE) injection 10 mg  10 mg Intravenous Q6H PRN Laveda Norman, MD      . Melene Muller ON 05/17/2013] influenza vac split quadrivalent PF (FLUARIX) injection 0.5 mL  0.5 mL Intramuscular Tomorrow-1000 Laveda Norman, MD      . insulin aspart (novoLOG) injection 0-15 Units  0-15 Units Subcutaneous TID WC Laveda Norman, MD   3 Units at 05/16/13 1215  . insulin aspart (novoLOG) injection 4 Units  4 Units Subcutaneous TID WC Laveda Norman, MD   4 Units at 05/16/13 1214  . insulin NPH (HUMULIN N,NOVOLIN N) injection 15 Units  15 Units Subcutaneous QHS Laveda Norman, MD      . irbesartan (AVAPRO) tablet 150 mg  150 mg Oral Daily Laveda Norman, MD   150 mg at 05/16/13 1213  . metoprolol tartrate (LOPRESSOR) tablet 12.5 mg  12.5 mg Oral BID Laveda Norman, MD    12.5 mg at 05/16/13 1213  . morphine 2 MG/ML injection 2 mg  2 mg Intravenous Q4H PRN Laveda Norman, MD      . mycophenolate (CELLCEPT) capsule 500 mg  500 mg Oral BID Laveda Norman, MD   500 mg at 05/16/13 1213  . ondansetron (ZOFRAN) tablet 4 mg  4 mg Oral Q6H PRN Laveda Norman, MD       Or  . ondansetron [facility]) injection 4 mg  4 mg Intravenous Q6H PRN Laveda Norman, MD      . oxyCODONE (Oxy IR/ROXICODONE) immediate release tablet 5 mg  5 mg Oral Q4H PRN Laveda Norman, MD      . pantoprazole (PROTONIX) EC tablet 40 mg  40 mg Oral Daily Laveda Norman, MD   40 mg at 05/16/13 1212  . [START ON 05/17/2013] pneumococcal 23 valent vaccine (PNU-IMMUNE) injection 0.5 mL  0.5 mL Intramuscular Tomorrow-1000 Laveda Norman, MD      . predniSONE (DELTASONE) tablet 5 mg  5 mg Oral Daily Laveda Norman, MD   5 mg at 05/16/13 1212  . simvastatin (ZOCOR) tablet 20 mg  20 mg Oral QPM Laveda Norman, MD      . sodium chloride 0.9 % injection 3 mL  3 mL Intravenous Q12H Laveda Norman, MD      . sodium chloride 0.9 % injection 3 mL  3 mL Intravenous Q12H Laveda Norman, MD      . sodium chloride 0.9 % injection 3 mL  3 mL Intravenous PRN Laveda Norman, MD      . tacrolimus (PROGRAF) capsule 3 mg  3 mg Oral BID Laveda Norman, MD   3 mg at 05/16/13 1213  . terazosin (HYTRIN) capsule 10 mg  10 mg Oral Daily Laveda Norman, MD   10 mg at 05/16/13 1212  . vitamin B-12 (CYANOCOBALAMIN) tablet 1,000 mcg  1,000 mcg Oral Daily Laveda Norman, MD   1,000 mcg at 05/16/13 1213    Past Medical History  Diagnosis Date  . Hypertension   . Diabetes mellitus without complication   . CHF (congestive heart failure)   . Hypercholesteremia   . Renal disorder     Past Surgical History  Procedure Laterality Date  . Kidney transplant Right 05/14/2010  . Av fistula placement Left July 2011  . Nephrectomy transplanted organ    . Hernia repair    . Rotator cuff repair      History   Social History  . Marital Status: Married  Spouse Name: N/A    Number  of Children: N/A  . Years of Education: N/A   Occupational History  . Not on file.   Social History Main Topics  . Smoking status: Former Games developer  . Smokeless tobacco: Never Used  . Alcohol Use: No  . Drug Use: No  . Sexual Activity: Not on file   Other Topics Concern  . Not on file   Social History Narrative  . No narrative on file     History reviewed. No pertinent family history.   Prescriptions prior to admission  Medication Sig Dispense Refill  . aspirin 81 MG tablet Take 81 mg by mouth every other day.      . Calcium Carbonate-Vitamin D (CALTRATE 600+D PO) Take 1 tablet by mouth 2 (two) times daily.      . chlorthalidone (HYGROTON) 25 MG tablet Take 12.5 mg by mouth daily.      . cyanocobalamin 1000 MCG tablet Take 1,000 mcg by mouth daily.      . finasteride (PROSCAR) 5 MG tablet Take 5 mg by mouth daily.      Marland Kitchen gabapentin (NEURONTIN) 300 MG capsule Take 300 mg by mouth every evening.       Marland Kitchen lisinopril (PRINIVIL,ZESTRIL) 10 MG tablet Take 5 mg by mouth 2 (two) times daily.       . metoprolol tartrate (LOPRESSOR) 25 MG tablet Take 12.5 mg by mouth 2 (two) times daily.      . mycophenolate (CELLCEPT) 250 MG capsule Take 500 mg by mouth 2 (two) times daily.       Marland Kitchen omeprazole (PRILOSEC) 20 MG capsule Take 20 mg by mouth daily.      . predniSONE (DELTASONE) 5 MG tablet Take 5 mg by mouth daily.      . simvastatin (ZOCOR) 40 MG tablet Take 20 mg by mouth every evening.      . tacrolimus (PROGRAF) 1 MG capsule Take 3 mg by mouth 2 (two) times daily.       Marland Kitchen terazosin (HYTRIN) 10 MG capsule Take 10 mg by mouth daily.          Review of systems complete and found to be negative unless listed above in HPI     Physical exam Blood pressure 135/69, pulse 66, temperature 98.2 F (36.8 C), temperature source Oral, resp. rate 20, height 6\' 1"  (1.854 m), weight 231 lb (104.781 kg), SpO2 98.00%. General: NAD Neck: No JVD, no thyromegaly or thyroid nodule.  Lungs: Clear to  auscultation bilaterally with normal respiratory effort. CV: Nondisplaced PMI.  Heart regular S1/S2, no S3/S4, II/VI ejection systolic murmur best heard at RUSB.  No peripheral edema.  No carotid bruit.  Normal pedal pulses.  Abdomen: Soft, nontender, no hepatosplenomegaly, no distention.  Skin: Intact without lesions or rashes.  Neurologic: Alert and oriented x 3.  Psych: Normal affect. Extremities: No clubbing or cyanosis.  HEENT: Normal.   Labs:   Lab Results  Component Value Date   WBC 7.8 05/16/2013   HGB 11.8* 05/16/2013   HCT 37.5* 05/16/2013   MCV 94.7 05/16/2013   PLT 147* 05/16/2013    Recent Labs Lab 05/16/13 0508 05/16/13 0920  NA 139  --   K 4.4  --   CL 101  --   CO2 28  --   BUN 24*  --   CREATININE 1.46* 1.43*  CALCIUM 8.7  --   GLUCOSE 205*  --    Lab Results  Component Value  Date   TROPONINI <0.30 05/16/2013    Lab Results  Component Value Date   CHOL 96 05/16/2013   Lab Results  Component Value Date   HDL 63 05/16/2013   Lab Results  Component Value Date   LDLCALC 23 05/16/2013   Lab Results  Component Value Date   TRIG 49 05/16/2013   Lab Results  Component Value Date   CHOLHDL 1.5 05/16/2013   No results found for this basename: LDLDIRECT       EKG: Sinus rhythm, rate 85 bpm, 1st degree AV block (PR 267 ms), NSST abnormality in V6  Studies: Chest xray showed CHF and COPD  ASSESSMENT AND PLAN:  1. Acute diastolic heart failure: symptoms, CXR, accelerated HTN, and BNP all consistent with acute decompensation of HFpEF. He is currently on IV Lasix 40 mg bid, irbesartan, and metoprolol, all of which I would continue. An echocardiogram is pending, but given the severe LVH noted on his echo at [facility] in June, he likely has diastolic dysfunction with restrictive physiology, and an elevated LVEDP. In addition to antihypertensive medication titration, I educated him on the importance of dietary compliance with a low-sodium diet. He had a recent  submaximal stress test (stress echo) in June, and I do not feel an ischemic evaluation is warranted at this time. Troponin also normal and ECG does not show any acute ischemic changes. He was taking 20 mg Lasix daily as outpatient, and this dose can be continued provided his BP is well controlled and he is compliant with a low sodium diet. 2. Accelerated HTN: see #1. Agree with ARB. He had been on low-dose lisinopril (5 mg bid) at home. If need be, amlodipine can be restarted.   Signed: Prentice Docker, M.D., F.A.C.C. 05/16/2013, 3:04 PM

## 2013-05-16 NOTE — ED Notes (Signed)
Family at bedside. 

## 2013-05-16 NOTE — ED Notes (Signed)
Lab at bedside

## 2013-05-16 NOTE — ED Notes (Signed)
MD at bedside. 

## 2013-05-17 DIAGNOSIS — J449 Chronic obstructive pulmonary disease, unspecified: Secondary | ICD-10-CM

## 2013-05-17 DIAGNOSIS — I517 Cardiomegaly: Secondary | ICD-10-CM

## 2013-05-17 DIAGNOSIS — N400 Enlarged prostate without lower urinary tract symptoms: Secondary | ICD-10-CM

## 2013-05-17 LAB — COMPREHENSIVE METABOLIC PANEL
ALT: 10 U/L (ref 0–53)
BUN: 25 mg/dL — ABNORMAL HIGH (ref 6–23)
Calcium: 8.9 mg/dL (ref 8.4–10.5)
Creatinine, Ser: 1.35 mg/dL (ref 0.50–1.35)
GFR calc Af Amer: 60 mL/min — ABNORMAL LOW (ref >90)
Glucose, Bld: 130 mg/dL — ABNORMAL HIGH (ref 70–99)
Sodium: 139 mEq/L (ref 135–145)
Total Protein: 5.8 g/dL — ABNORMAL LOW (ref 6.0–8.3)

## 2013-05-17 LAB — CBC
HCT: 35.6 % — ABNORMAL LOW (ref 39.0–52.0)
MCH: 29.7 pg (ref 26.0–34.0)
MCHC: 31.5 g/dL (ref 30.0–36.0)
RDW: 13.5 % (ref 11.5–15.5)

## 2013-05-17 LAB — GLUCOSE, CAPILLARY
Glucose-Capillary: 133 mg/dL — ABNORMAL HIGH (ref 70–99)
Glucose-Capillary: 241 mg/dL — ABNORMAL HIGH (ref 70–99)

## 2013-05-17 LAB — HEMOGLOBIN A1C
Hgb A1c MFr Bld: 6.5 % — ABNORMAL HIGH (ref <5.7)
Mean Plasma Glucose: 140 mg/dL — ABNORMAL HIGH (ref <117)

## 2013-05-17 MED ORDER — INSULIN NPH (HUMAN) (ISOPHANE) 100 UNIT/ML ~~LOC~~ SUSP
13.0000 [IU] | Freq: Every day | SUBCUTANEOUS | Status: DC
  Administered 2013-05-17: 13 [IU] via SUBCUTANEOUS
  Filled 2013-05-17: qty 10

## 2013-05-17 MED ORDER — FUROSEMIDE 40 MG PO TABS
40.0000 mg | ORAL_TABLET | Freq: Every day | ORAL | Status: DC
  Administered 2013-05-17 – 2013-05-18 (×2): 40 mg via ORAL
  Filled 2013-05-17 (×3): qty 1

## 2013-05-17 NOTE — Progress Notes (Addendum)
[facility]                                                                                Patient Demographics  Jon Burns, is a 72 y.o. male, DOB - 1941-03-23, AVW:[id number]  Admit date - 05/16/2013   Admitting Physician Laveda Norman, MD  Outpatient Primary MD for the patient is Marin Comment, FNP  LOS - 1   Chief Complaint  Patient presents with  . Shortness of Breath        Assessment & Plan    1. CHF (congestive heart failure): Has reported chronic diastolic dysfunction per his echogram at [facility]. This likely was Acute on chronic diastolic CHF with preserved EF, CHF likely brought on by uncontrolled hypertension due to recent medication changes done at [facility], patient is much better with gentle diuresis and now close to being compensated, cardiology is following, he is on low-dose beta blocker and Lasix, echogram is pending. If he remains stable and echogram is unremarkable he be discharged in the morning.  Echo   2. HTN (hypertension): BP is currently markedly elevated at 176/75-193/72, and this may be the culprit for CHF decompensation,. According to patient's [facility] records, Amlodipine was discontinued a couple of months ago, for borderline low BP, and according to patient's spouse, BP has remained uncontrolled, ever since. His blood pressure is much better on the present regimen of ARB and low-dose beta blocker, if need be we will add hydralazine.    3. Diabetes mellitus: Stable now on SSI meal cover and scheduled NPH, since a.m. glucose was slightly low will drop NPH to 13 units twice a day.  Lab Results  Component Value Date   HGBA1C 6.5* 05/16/2013    CBG (last 3)   Recent Labs  05/16/13 2144 05/17/13 0604 05/17/13 0803  GLUCAP 162* 61* 133*      4. Dyslipidemia: On statin, which will be continued.     Lab Results  Component Value Date   CHOL 96 05/16/2013   HDL 63 05/16/2013   LDLCALC 23 05/16/2013   TRIG 49 05/16/2013   CHOLHDL 1.5 05/16/2013      5. Chronic cough: This has been going on for about 3 months, and is non-productive. The culprit is likely to be patient's ACE-i. better with an ARB.     6. History of renal transplantation: Patent has a history of ESRD, secondary to diabetic nephropathy, s/p living donor kidney transplant 05/14/2010 and follows up at [facility] transplant clinic (baseline 1.5 to 1.8) . Creatinine is better than baseline at this point with diuresis. Following renal indices. His transplant medications will be continued unchanged.    7. GERD (gastroesophageal reflux disease): Asymptomatic on PPI.     8. BPH (benign prostatic hyperplasia): Not problematic.     Code Status: Full  Family Communication: none present  Disposition Plan: home   Procedures TTE   Consults  Cards   DVT Prophylaxis   Heparin    Lab Results  Component Value Date   PLT 140* 05/17/2013    Medications  Scheduled Meds: . aspirin  81 mg Oral QODAY  . calcium-vitamin D  1 tablet Oral BID  . finasteride  5 mg Oral Daily  . furosemide  40 mg Oral Daily  . gabapentin  300 mg Oral QPM  . heparin  5,000 Units Subcutaneous Q8H  . influenza vac split quadrivalent PF  0.5 mL Intramuscular Tomorrow-1000  . insulin aspart  0-15 Units Subcutaneous TID WC  . insulin aspart  4 Units Subcutaneous TID WC  . insulin NPH  15 Units Subcutaneous QHS  . irbesartan  150 mg Oral Daily  . metoprolol tartrate  12.5 mg Oral BID  . mycophenolate  500 mg Oral BID  . pantoprazole  40 mg Oral Daily  . pneumococcal 23 valent vaccine  0.5 mL Intramuscular Tomorrow-1000  . predniSONE  5 mg Oral Daily  . simvastatin  20 mg Oral QPM  . sodium chloride  3 mL Intravenous Q12H  . sodium chloride  3 mL Intravenous Q12H  . tacrolimus  3 mg Oral BID  . terazosin  10 mg Oral Daily  . cyanocobalamin  1,000 mcg Oral Daily   Continuous Infusions:  PRN Meds:.sodium chloride, acetaminophen, acetaminophen, albuterol, hydrALAZINE, morphine injection,  ondansetron (ZOFRAN) IV, ondansetron, oxyCODONE, sodium chloride  Antibiotics     Anti-infectives   None       Time Spent in minutes  35   Susa Raring K M.D on 05/17/2013 at 8:57 AM  Between 7am to 7pm - Pager - [phone]  After 7pm go to [email or web address] - password [email or web address]  And look for the night coverage person covering for me after hours  [facility] Office  [phone]    Subjective:   Jon Burns today has, No headache, No chest pain, No abdominal pain - No Nausea, No new weakness tingling or numbness, No Cough - SOB.    Objective:   Filed Vitals:   05/16/13 1008 05/16/13 1345 05/16/13 2016 05/17/13 0515  BP: 170/83 135/69 174/70 148/77  Pulse: 72 66 66 67  Temp: 98.2 F (36.8 C) 98.2 F (36.8 C) 98.8 F (37.1 C) 97.8 F (36.6 C)  TempSrc: Oral Oral Oral Oral  Resp: 28 20 20 20   Height: 6\' 1"  (1.854 m)     Weight: 104.781 kg (231 lb)   102.1 kg (225 lb 1.4 oz)  SpO2: 99% 98% 100% 100%    Wt Readings from Last 3 Encounters:  05/17/13 102.1 kg (225 lb 1.4 oz)     Intake/Output Summary (Last 24 hours) at 05/17/13 0857 Last data filed at 05/17/13 0800  Gross per 24 hour  Intake    600 ml  Output   4050 ml  Net  -3450 ml    Exam Awake Alert, Oriented X 3, No new F.N deficits, Normal affect [place].AT,PERRAL Supple Neck,No JVD, No cervical lymphadenopathy appriciated.  Symmetrical Chest wall movement, Good air movement bilaterally, CTAB RRR,No Gallops,Rubs or new Murmurs, No Parasternal Heave +ve B.Sounds, Abd Soft, Non tender, No organomegaly appriciated, No rebound - guarding or rigidity. No Cyanosis, Clubbing or edema, No new Rash or bruise    Data Review   Micro Results No results found for this or any previous visit (from the past 240 hour(s)).  Radiology Reports Dg Chest 2 View  05/16/2013   CLINICAL DATA:  Shortness of Breath.  EXAM: CHEST  2 VIEW  COMPARISON:  12/29/2012.  FINDINGS: Diffuse interstitial opacities with  Kerley B-lines and fissural thickening. Pulmonary hyperinflation. Previously seen focal opacity at the right base has largely resolved. Small pleural effusions. No cardiomegaly. Aortic atherosclerosis.  IMPRESSION: 1. CHF. 2. COPD.   Electronically Signed  By: Tiburcio Pea   On: 05/16/2013 05:52    CBC  Recent Labs Lab 05/16/13 0508 05/16/13 0920 05/17/13 0425  WBC 6.9 7.8 5.4  HGB 11.6* 11.8* 11.2*  HCT 37.1* 37.5* 35.6*  PLT 154 147* 140*  MCV 95.4 94.7 94.4  MCH 29.8 29.8 29.7  MCHC 31.3 31.5 31.5  RDW 13.6 13.5 13.5    Chemistries   Recent Labs Lab 05/16/13 0508 05/16/13 0920 05/17/13 0425  NA 139  --  139  K 4.4  --  4.3  CL 101  --  99  CO2 28  --  32  GLUCOSE 205*  --  130*  BUN 24*  --  25*  CREATININE 1.46* 1.43* 1.35  CALCIUM 8.7  --  8.9  AST  --   --  11  ALT  --   --  10  ALKPHOS  --   --  43  BILITOT  --   --  0.4   ------------------------------------------------------------------------------------------------------------------ estimated creatinine clearance is 64 ml/min (by C-G formula based on Cr of 1.35). ------------------------------------------------------------------------------------------------------------------  Recent Labs  05/16/13 0920  HGBA1C 6.5*   ------------------------------------------------------------------------------------------------------------------  Recent Labs  05/16/13 0944  CHOL 96  HDL 63  LDLCALC 23  TRIG 49  CHOLHDL 1.5   ------------------------------------------------------------------------------------------------------------------  Recent Labs  05/16/13 0920  TSH 0.882   ------------------------------------------------------------------------------------------------------------------ No results found for this basename: VITAMINB12, FOLATE, FERRITIN, TIBC, IRON, RETICCTPCT,  in the last 72 hours  Coagulation profile No results found for this basename: INR, PROTIME,  in the last 168  hours  No results found for this basename: DDIMER,  in the last 72 hours  Cardiac Enzymes  Recent Labs Lab 05/16/13 0944  TROPONINI <0.30   ------------------------------------------------------------------------------------------------------------------ No components found with this basename: POCBNP,

## 2013-05-17 NOTE — Progress Notes (Signed)
Patient had accu ck. Of 68 . Patient voiced no complaints. Given 1 pack of graham crackers and peanut butter and one carton of milk. Awaiting repeat accu ck. By. N.T.

## 2013-05-17 NOTE — Progress Notes (Signed)
Patient ID: Jon Burns, male   DOB: July 22, 1941, 72 y.o.   MRN: [id number]    Subjective:  Denies SSCP, palpitations or Dyspnea   Objective:  Filed Vitals:   05/16/13 1008 05/16/13 1345 05/16/13 2016 05/17/13 0515  BP: 170/83 135/69 174/70 148/77  Pulse: 72 66 66 67  Temp: 98.2 F (36.8 C) 98.2 F (36.8 C) 98.8 F (37.1 C) 97.8 F (36.6 C)  TempSrc: Oral Oral Oral Oral  Resp: 28 20 20 20   Height: 6\' 1"  (1.854 m)     Weight: 231 lb (104.781 kg)   225 lb 1.4 oz (102.1 kg)  SpO2: 99% 98% 100% 100%    Intake/Output from previous day:  Intake/Output Summary (Last 24 hours) at 05/17/13 0947 Last data filed at 05/17/13 0800  Gross per 24 hour  Intake    600 ml  Output   4050 ml  Net  -3450 ml    Physical Exam: Affect appropriate Healthy:  appears stated age HEENT: normal Neck supple with no adenopathy JVP normal no bruits no thyromegaly Lungs expitory wheezing and good diaphragmatic motion Heart:  S1/S2 no murmur, no rub, gallop or click PMI normal Abdomen: benighn, BS positve, no tenderness, no AAA no bruit.  No HSM or HJR Distal pulses intact with no bruits No edema Neuro non-focal Skin warm and dry No muscular weakness   Lab Results: Basic Metabolic Panel:  Recent Labs  40/98/11 0508 05/16/13 0920 05/17/13 0425  NA 139  --  139  K 4.4  --  4.3  CL 101  --  99  CO2 28  --  32  GLUCOSE 205*  --  130*  BUN 24*  --  25*  CREATININE 1.46* 1.43* 1.35  CALCIUM 8.7  --  8.9   Liver Function Tests:  Recent Labs  05/17/13 0425  AST 11  ALT 10  ALKPHOS 43  BILITOT 0.4  PROT 5.8*  ALBUMIN 3.1*   CBC:  Recent Labs  05/16/13 0920 05/17/13 0425  WBC 7.8 5.4  HGB 11.8* 11.2*  HCT 37.5* 35.6*  MCV 94.7 94.4  PLT 147* 140*   Cardiac Enzymes:  Recent Labs  05/16/13 0944  TROPONINI <0.30   Hemoglobin A1C:  Recent Labs  05/16/13 0920  HGBA1C 6.5*   Fasting Lipid Panel:  Recent Labs  05/16/13 0944  CHOL 96  HDL 63  LDLCALC 23    TRIG 49  CHOLHDL 1.5   Thyroid Function Tests:  Recent Labs  05/16/13 0920  TSH 0.882    Imaging: Dg Chest 2 View  05/16/2013   CLINICAL DATA:  Shortness of Breath.  EXAM: CHEST  2 VIEW  COMPARISON:  12/29/2012.  FINDINGS: Diffuse interstitial opacities with Kerley B-lines and fissural thickening. Pulmonary hyperinflation. Previously seen focal opacity at the right base has largely resolved. Small pleural effusions. No cardiomegaly. Aortic atherosclerosis.  IMPRESSION: 1. CHF. 2. COPD.   Electronically Signed   By: Tiburcio Pea   On: 05/16/2013 05:52    Cardiac Studies:  ECG:   NSR no acute ischemic changes   Telemetry:   NSR no VT or arrhythmia  Echo: pending  Medications:   . aspirin  81 mg Oral QODAY  . calcium-vitamin D  1 tablet Oral BID  . finasteride  5 mg Oral Daily  . furosemide  40 mg Oral Daily  . gabapentin  300 mg Oral QPM  . heparin  5,000 Units Subcutaneous Q8H  . influenza vac split quadrivalent PF  0.5  mL Intramuscular Tomorrow-1000  . insulin aspart  0-15 Units Subcutaneous TID WC  . insulin aspart  4 Units Subcutaneous TID WC  . insulin NPH  13 Units Subcutaneous QHS  . irbesartan  150 mg Oral Daily  . metoprolol tartrate  12.5 mg Oral BID  . mycophenolate  500 mg Oral BID  . pantoprazole  40 mg Oral Daily  . pneumococcal 23 valent vaccine  0.5 mL Intramuscular Tomorrow-1000  . predniSONE  5 mg Oral Daily  . simvastatin  20 mg Oral QPM  . sodium chloride  3 mL Intravenous Q12H  . sodium chloride  3 mL Intravenous Q12H  . tacrolimus  3 mg Oral BID  . terazosin  10 mg Oral Daily  . cyanocobalamin  1,000 mcg Oral Daily       Assessment/Plan:  Diastolic CHF:  Related to mild CRF post transplant and HTN  Improved continue current meds Dyspnea:  Component of COPD with ongoing wheezing ? Start inahaler HTN:  Improved continue current meds  Echo today ok to d/c in am  If EF normal Known LVH on echo at [facility] 05/17/2013, 9:47  AM

## 2013-05-17 NOTE — Progress Notes (Signed)
  Echocardiogram 2D Echocardiogram has been performed.  Jon Burns 05/17/2013, 11:04 AM

## 2013-05-17 NOTE — Progress Notes (Signed)
Patient called out and stated his arm was bleeding. RN to room to assess. "Quarter-size" skin tear noted to left forearm. Patient state that he'd scraped his arm on the door frame in the bathroom. Site cleansed with chlorohexidine prep and silicone foam dressing applied. Will continue to monitor.Mamie Levers

## 2013-05-17 NOTE — Progress Notes (Signed)
UR Completed.  Jon Burns [phone] 05/17/2013

## 2013-05-17 NOTE — Evaluation (Signed)
Physical Therapy Evaluation Patient Details Name: Jon Burns MRN: [id number] DOB: 03-12-1941 Today's Date: 05/17/2013 Time: 0865-7846 PT Time Calculation (min): 10 min  PT Assessment / Plan / Recommendation History of Present Illness  Patient is a 72 y.o. male presenting with shortness of breath  Clinical Impression  Patient independent no acute needs, will sign off.     PT Assessment  Patent does not need any further PT services                      Precautions / Restrictions     Pertinent Vitals/Pain No DOE on rm air with activity, no pain      Mobility  Bed Mobility Bed Mobility: Supine to Sit;Sitting - Scoot to Edge of Bed Supine to Sit: 7: Independent Sitting - Scoot to [place] of Bed: 7: Independent Transfers Transfers: Sit to Stand;Stand to Sit Sit to Stand: 7: Independent Stand to Sit: 7: Independent Ambulation/Gait Ambulation/Gait Assistance: 7: Independent Ambulation Distance (Feet): 200 Feet Assistive device: None Ambulation/Gait Assistance Details: WFL Gait Pattern: Within Functional Limits Gait velocity: WFL for community ambulation       PT Goals(Current goals can be found in the care plan section)    Visit Information  Last PT Received On: 05/17/13 Assistance Needed: +1 History of Present Illness: Patient is a 72 y.o. male presenting with shortness of breath       Prior Functioning  Home Living Family/patient expects to be discharged to:: Private residence Living Arrangements: Spouse/significant other Available Help at Discharge: Family Type of Home: House Home Access: Stairs to enter Secretary/administrator of Steps: 3 Entrance Stairs-Rails: None Home Layout: One level Prior Function Level of Independence: Independent Communication Communication: No difficulties Dominant Hand: Right    Cognition  Cognition Arousal/Alertness: Awake/alert Behavior During Therapy: WFL for tasks assessed/performed Overall Cognitive Status:  Within Functional Limits for tasks assessed    Extremity/Trunk Assessment Upper Extremity Assessment Upper Extremity Assessment: Overall WFL for tasks assessed Lower Extremity Assessment Lower Extremity Assessment: Overall WFL for tasks assessed   Balance Balance Balance Assessed: Yes High Level Balance High Level Balance Activites: Side stepping;Backward walking;Direction changes;Turns;Sudden stops;Head turns High Level Balance Comments: WFL  End of Session PT - End of Session Equipment Utilized During Treatment: Gait belt Activity Tolerance: Patient tolerated treatment well Patient left: Other (comment) (with transport) Nurse Communication: Mobility status  GP     Fabio Asa 05/17/2013, 10:47 AM Charlotte Crumb, PT DPT  [phone]

## 2013-05-17 NOTE — Progress Notes (Signed)
OT Cancellation Note  Patient Details Name: Jon Burns MRN: [id number] DOB: 03-Jun-1941   Cancelled Treatment:    Reason Eval/Treat Not Completed: Other (comment);OT screened, no needs identified, will sign off. Per PT, pt independent with mobility as well  Galen Manila 05/17/2013, 10:39 AM

## 2013-05-18 ENCOUNTER — Encounter (HOSPITAL_COMMUNITY): Payer: Self-pay

## 2013-05-18 DIAGNOSIS — R05 Cough: Secondary | ICD-10-CM

## 2013-05-18 DIAGNOSIS — E785 Hyperlipidemia, unspecified: Secondary | ICD-10-CM

## 2013-05-18 DIAGNOSIS — I509 Heart failure, unspecified: Secondary | ICD-10-CM

## 2013-05-18 DIAGNOSIS — I10 Essential (primary) hypertension: Secondary | ICD-10-CM

## 2013-05-18 DIAGNOSIS — E119 Type 2 diabetes mellitus without complications: Secondary | ICD-10-CM

## 2013-05-18 MED ORDER — HYDRALAZINE HCL 25 MG PO TABS
25.0000 mg | ORAL_TABLET | Freq: Three times a day (TID) | ORAL | Status: DC
  Administered 2013-05-18: 25 mg via ORAL
  Filled 2013-05-18 (×5): qty 1

## 2013-05-18 MED ORDER — FUROSEMIDE 20 MG PO TABS: 20.0000 mg | ORAL_TABLET | Freq: Every day | ORAL | Status: DC

## 2013-05-18 MED ORDER — HYDRALAZINE HCL 25 MG PO TABS: 25.0000 mg | ORAL_TABLET | Freq: Three times a day (TID) | ORAL | Status: DC

## 2013-05-18 NOTE — Progress Notes (Signed)
Pt and wife given Heart Failure packet. Packet reviewed with pt and wife and all questions answered.

## 2013-05-18 NOTE — Care Management Note (Signed)
    Page 1 of 1   05/18/2013     3:02:51 PM   CARE MANAGEMENT NOTE 05/18/2013  Patient:  Jon Burns, Jon Burns   Account Number:  [id number]  Date Initiated:  05/18/2013  Documentation initiated by:  Toshio Slusher  Subjective/Objective Assessment:   PT ADM ON 05/16/13 WITH CHF EXACERBATION.  PTA, PT INDEPENDENT, LIVES WITH SPOUSE.     Action/Plan:   PT WOULD BENEFIT FROM HHRN FOR CHF FOLLOW UP AT DC.   Anticipated DC Date:  05/18/2013   Anticipated DC Plan:  HOME W HOME HEALTH SERVICES      DC Planning Services  CM consult      [facility] Choice  HOME HEALTH   Choice offered to / List presented to:  C-1 Patient        HH arranged  HH-1 RN  HH-10 DISEASE MANAGEMENT      HH agency  [facility]   Status of service:  Completed, signed off Medicare Important Message given?   (If response is "NO", the following Medicare IM given date fields will be blank) Date Medicare IM given:   Date Additional Medicare IM given:    Discharge Disposition:  HOME W HOME HEALTH SERVICES  Per UR Regulation:  Reviewed for med. necessity/level of care/duration of stay  If discussed at Long Length of Stay Meetings, dates discussed:    Comments:  05/18/13 Ellington Greenslade,RN,BSN [phone] PT FOR DC HOME TODAY.  HHRN FOR CHF FOLLOW UP ORDERED. REFERRAL FAXED TO [facility], PER PT/WIFE CHOICE. (FAX [phone]), ATTN. CAROLYN.  START OF CARE 24-48H POST DC DATE.

## 2013-05-18 NOTE — Discharge Summary (Signed)
[facility]                                                                                   Jon Burns, is a 72 y.o. male  DOB 11-Jan-1941  MRN [id number].  Admission date:  05/16/2013  Admitting Physician  Laveda [place], MD  Discharge Date:  05/18/2013   Primary MD  Marin Comment, FNP  Admission Diagnosis  COPD (chronic obstructive pulmonary disease) [496] Chronic cough [786.2] CHF (congestive heart failure) [428.0] History of renal transplant [V42.0] Diabetes mellitus [250.00]  Discharge Diagnosis     Active Problems:   CHF (congestive heart failure)   HTN (hypertension)   Diabetes mellitus   Dyslipidemia   Chronic cough   History of renal transplantation   GERD (gastroesophageal reflux disease)   BPH (benign prostatic hyperplasia)      Past Medical History  Diagnosis Date  . Hypertension   . Diabetes mellitus without complication   . CHF (congestive heart failure)   . Hypercholesteremia   . Renal disorder     Past Surgical History  Procedure Laterality Date  . Kidney transplant Right 05/14/2010  . Av fistula placement Left July 2011  . Nephrectomy transplanted organ    . Hernia repair    . Rotator cuff repair       Recommendations for primary care physician for things to follow:   Follow weight, Blood pressure,BMP and diuretic dose closely   Discharge Diagnoses:   Active Problems:   CHF (congestive heart failure)   HTN (hypertension)   Diabetes mellitus   Dyslipidemia   Chronic cough   History of renal transplantation   GERD (gastroesophageal reflux disease)   BPH (benign prostatic hyperplasia)    Discharge Condition: stable   Follow-up Information   Follow up with Marin Comment, FNP. Schedule an appointment as soon as possible for a visit in 4 days.   Specialty:  Nurse Practitioner   Contact information:   [address] [place] [address] [phone]       Follow up with Charlton Haws, MD. Schedule an appointment as soon  as possible for a visit in 1 week.   Specialty:  Cardiology   Contact information:   [address] [place] [address] [phone]       Follow up with Your [facility] Doctor. Schedule an appointment as soon as possible for a visit in 4 days.        Consults obtained - Cardiology   Discharge Medications      Medication List         aspirin EC 81 MG tablet  Take 81 mg by mouth daily.     CALTRATE 600+D PO  Take 1 tablet by mouth 2 (two) times daily.     chlorthalidone 50 MG tablet  Commonly known as:  HYGROTON  Take 25 mg by mouth daily.     cyanocobalamin 1000 MCG tablet  Take 1,000 mcg by mouth daily.     finasteride 5 MG tablet  Commonly known as:  PROSCAR  Take 5 mg by mouth daily.     furosemide 20 MG tablet  Commonly known as:  LASIX  Take 1 tablet (20 mg total) by mouth daily. Get BMP and Potassium checked within 3-4 days by your Primary MD and dose of this medication adjusted     gabapentin 400 MG capsule  Commonly known as:  NEURONTIN  Take 400 mg by mouth 2 (two) times daily.     hydrALAZINE 25 MG tablet  Commonly known as:  APRESOLINE  Take 1 tablet (25 mg total) by mouth every 8 (eight) hours.     HYDROcodone-acetaminophen 5-325 MG per tablet  Commonly known as:  NORCO/VICODIN  Take 1 tablet by mouth 3 (three) times daily as needed for pain. For pain     insulin NPH 100 UNIT/ML injection  Commonly known as:  HUMULIN N,NOVOLIN N  Inject 28 Units into the skin at bedtime.     lisinopril 10 MG tablet  Commonly known as:  PRINIVIL,ZESTRIL  Take 5 mg by mouth 2 (two) times daily.     metoprolol tartrate 25 MG tablet  Commonly known as:  LOPRESSOR  Take 12.5 mg by mouth 2 (two) times daily.     mycophenolate 250 MG capsule  Commonly known as:  CELLCEPT  Take 500 mg by mouth 2 (two) times daily.     omeprazole 20 MG capsule  Commonly known as:  PRILOSEC  Take 20 mg by mouth daily.     predniSONE 5 MG tablet  Commonly  known as:  DELTASONE  Take 5 mg by mouth daily.     simvastatin 40 MG tablet  Commonly known as:  ZOCOR  Take 20 mg by mouth every evening.     tacrolimus 1 MG capsule  Commonly known as:  PROGRAF  Take 3 mg by mouth 2 (two) times daily.     terazosin 10 MG capsule  Commonly known as:  HYTRIN  Take 10 mg by mouth daily.         Diet and Activity recommendation: See Discharge Instructions below   Discharge Instructions     Follow with Primary MD Marin Comment, FNP in 3 days   Get CBC, CMP, checked 3 days by Primary MD and again as instructed by your Primary MD. Get a 2 view Chest X ray done next visit.  Get Medicines reviewed and adjusted.  Please request your Prim.MD to go over all Hospital Tests and Procedure/Radiological results at the follow up, please get all Hospital records sent to your Prim MD by signing hospital release before you go home.  Activity: As tolerated with Full fall precautions use walker/cane & assistance as needed   Diet:  Heart Healthy,  Fluid restriction 1.8 lit/day, Aspiration precautions.  Check your Weight same time everyday, if you gain over 2 pounds, or you develop in leg swelling, experience more shortness of breath or chest pain, call your Primary MD immediately. Follow Cardiac Low Salt Diet and 1.8 lit/day fluid restriction.  Disposition Home    If you experience worsening of your admission symptoms, develop shortness of breath, life threatening emergency, suicidal or homicidal thoughts you must seek medical attention immediately by calling 911 or calling your MD immediately  if symptoms less severe.  You Must read complete instructions/literature along with all the possible adverse reactions/side effects for all the Medicines you take and that have been prescribed to you. Take any new Medicines after you have completely understood and accpet all the possible adverse reactions/side effects.   Do not drive and provide baby sitting services  if your were admitted for syncope or  siezures until you have seen by Primary MD or a Neurologist and advised to do so again.  Do not drive when taking Pain medications.    Do not take more than prescribed Pain, Sleep and Anxiety Medications  Special Instructions: If you have smoked or chewed Tobacco  in the last 2 yrs please stop smoking, stop any regular Alcohol  and or any Recreational drug use.  Wear Seat belts while driving.   Please note  You were cared for by a hospitalist during your hospital stay. If you have any questions about your discharge medications or the care you received while you were in the hospital after you are discharged, you can call the unit and asked to speak with the hospitalist on call if the hospitalist that took care of you is not available. Once you are discharged, your primary care physician will handle any further medical issues. Please note that NO REFILLS for any discharge medications will be authorized once you are discharged, as it is imperative that you return to your primary care physician (or establish a relationship with a primary care physician if you do not have one) for your aftercare needs so that they can reassess your need for medications and monitor your lab values.    Major procedures and Radiology Reports - PLEASE review detailed and final reports for all details, in brief -   Echo  - Left ventricle: The cavity size was normal. Wall thickness was increased in a pattern of moderate LVH. Systolic function was normal. The estimated ejection fraction was in the range of 50% to 55%. Wall motion was normal; there were no regional wall motion abnormalities. Doppler parameters are consistent with abnormal left ventricular relaxation (grade 1 diastolic dysfunction). - Left atrium: The atrium was mildly dilated. - Right ventricle: The cavity size was mildly dilated. - Right atrium: The atrium was mildly dilated.     Dg Chest 2 View  05/16/2013    CLINICAL DATA:  Shortness of Breath.  EXAM: CHEST  2 VIEW  COMPARISON:  12/29/2012.  FINDINGS: Diffuse interstitial opacities with Kerley B-lines and fissural thickening. Pulmonary hyperinflation. Previously seen focal opacity at the right base has largely resolved. Small pleural effusions. No cardiomegaly. Aortic atherosclerosis.  IMPRESSION: 1. CHF. 2. COPD.   Electronically Signed   By: Tiburcio Pea   On: 05/16/2013 05:52    Micro Results      No results found for this or any previous visit (from the past 240 hour(s)).   History of present illness and  Hospital Course:     Kindly see H&P for history of present illness and admission details, please review complete Labs, Consult reports and Test reports for all details in brief Sharone Almond, is a 72 y.o. male, patient with history of  With history of diabetes mellitus type 2 now insulin-dependent, status post renal transplant, chronic kidney disease stage IV, chronic diastolic dysfunction, hypertension in poor control, BPH,Dyslipidemia, was admitted to the hospital with chief complaints of shortness of breath along with orthopnea, workup was consistent with acute on chronic diastolic CHF, echogram done here confirmed grade 1 diastolic dysfunction with preserved EF of 50-55%, patient was given low-dose IV Lasix with good effect, he was close to being compensated within 24 hours of his admission, he completely symptom-free without requiring any oxygen, for his poorly-controlled blood pressure his blood pressure medications have been titrated with good effect. He was seen by cardiology and cleared for home discharge.  He will be now  placed on hydralazine and low-dose Lasix in addition to his home medications, we'll request his primary care physician to kindly monitor his weight, blood pressure, BMP and adjust blood pressure and diuretic medications on a close basis. Patient will be provided with CHF instructions upon discharge.    For his  underlying diabetes mellitus type 2, dyslipidemia, BPH he would resume his home medications unchanged.     Today   Subjective:   Damyon Mullane today has no headache,no chest abdominal pain,no new weakness tingling or numbness, feels much better wants to go home today.    Objective:   Blood pressure bedside 118/60, pulse 65, temperature 97.2 F (36.2 C), temperature source Oral, resp. rate 18, height 6\' 1"  (1.854 m), weight 101.515 kg (223 lb 12.8 oz), SpO2 99.00%.   Intake/Output Summary (Last 24 hours) at 05/18/13 1610 Last data filed at 05/17/13 1200  Gross per 24 hour  Intake    240 ml  Output      0 ml  Net    240 ml    Exam Awake Alert, Oriented *3, No new F.N deficits, Normal affect [place].AT,PERRAL Supple Neck,No JVD, No cervical lymphadenopathy appriciated.  Symmetrical Chest wall movement, Good air movement bilaterally, CTAB RRR,No Gallops,Rubs or new Murmurs, No Parasternal Heave +ve B.Sounds, Abd Soft, Non tender, No organomegaly appriciated, No rebound -guarding or rigidity. No Cyanosis, Clubbing or edema, No new Rash or bruise  Data Review   Lab Results  Component Value Date   HGBA1C 6.5* 05/16/2013    Lab Results  Component Value Date   CHOL 96 05/16/2013   HDL 63 05/16/2013   LDLCALC 23 05/16/2013   TRIG 49 05/16/2013   CHOLHDL 1.5 05/16/2013     CBC w Diff: Lab Results  Component Value Date   WBC 5.4 05/17/2013   HGB 11.2* 05/17/2013   HCT 35.6* 05/17/2013   PLT 140* 05/17/2013    CMP: Lab Results  Component Value Date   NA 139 05/17/2013   K 4.3 05/17/2013   CL 99 05/17/2013   CO2 32 05/17/2013   BUN 25* 05/17/2013   CREATININE 1.35 05/17/2013   PROT 5.8* 05/17/2013   ALBUMIN 3.1* 05/17/2013   BILITOT 0.4 05/17/2013   ALKPHOS 43 05/17/2013   AST 11 05/17/2013   ALT 10 05/17/2013  .   Total Time in preparing paper work, data evaluation and todays exam - 35 minutes  Leroy Sea M.D on 05/18/2013 at 7:12 AM  [facility] Office   [phone]

## 2013-05-18 NOTE — Progress Notes (Signed)
Pt discharged per MD order and protocol. Discharge instructions reviewed with patient and pts' wife, All questions answered. Pt given heart healthy low sodium diet sheet and reviewed with pt. PT already given heart failure packet. Pt given all prescriptions. Pt aware of all follow up appointments.

## 2013-06-14 ENCOUNTER — Encounter: Payer: Self-pay | Admitting: Cardiovascular Disease

## 2013-06-15 ENCOUNTER — Ambulatory Visit (INDEPENDENT_AMBULATORY_CARE_PROVIDER_SITE_OTHER): Payer: No Typology Code available for payment source | Admitting: Cardiovascular Disease

## 2013-06-15 ENCOUNTER — Encounter: Payer: Self-pay | Admitting: Cardiovascular Disease

## 2013-06-15 VITALS — BP 122/58 | HR 42 | Ht 73.0 in | Wt 218.0 lb

## 2013-06-15 DIAGNOSIS — I10 Essential (primary) hypertension: Secondary | ICD-10-CM

## 2013-06-15 DIAGNOSIS — J449 Chronic obstructive pulmonary disease, unspecified: Secondary | ICD-10-CM

## 2013-06-15 DIAGNOSIS — J4489 Other specified chronic obstructive pulmonary disease: Secondary | ICD-10-CM

## 2013-06-15 DIAGNOSIS — R0989 Other specified symptoms and signs involving the circulatory and respiratory systems: Secondary | ICD-10-CM

## 2013-06-15 NOTE — Patient Instructions (Signed)
Your physician recommends that you schedule a follow-up appointment as needed with Dr. Eden Emms  Your physician has requested that you have a carotid duplex. This test is an ultrasound of the carotid arteries in your neck. It looks at blood flow through these arteries that supply the brain with blood. Allow one hour for this exam. There are no restrictions or special instructions.  Your physician has recommended that you have a pulmonary function test. Pulmonary Function Tests are a group of tests that measure how well air moves in and out of your lungs.  We will make referral to Pulmonary doctor for you.

## 2013-06-15 NOTE — Progress Notes (Signed)
Patient ID: Jon Burns, male   DOB: Oct 08, 1940, 72 y.o.   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 In about April of this year, he had been on at least 2-3 antihypertensive medications along with Lasix, but as he apparently became fatigued and volume depleted, his Lasix was reduced to 20 mg daily and his amlodipine was discontinued.  On the morning of 05/15/13, he had some SOB which was relieved with a nebulized albuterol treatment. He denied any associated chest pain or diaphoresis.  This morning at approximately 3 AM, he again felt significantly short of breath but denied concomitant chest pain, diaphoresis, and palpitations.  He was brought to [facility] ED and as per the patient, his wife, and the medical record, after he was given 1 dose of IV Lasix 20 mg, his symptoms resolved.     An echocardiogram performed at [facility] on 02-02-2013 showed normal LV systolic function, EF>55%, with severe concentric LVH and mild LAE (no comment on diastolic function or LVEDP).  His stress test was submaximal in that he did not reach an adequate heart rate, although no inducible wall motion abnormalities were noted.  In hospital BP meds adjusted  Echo showed EF 50-55%    BP labile seems to be high in am and low during day Discussed timing of BP meds.  Has COPD but no recent evaluation.     ROS: Denies fever, malais, weight loss, blurry vision, decreased visual acuity, cough, sputum, SOB, hemoptysis, pleuritic pain, palpitaitons, heartburn, abdominal pain, melena, lower extremity edema, claudication, or rash.  All other systems reviewed and negative  General: Affect appropriate Chronically ill white male  HEENT: normal Neck supple with no adenopathy JVP normal bilateral  bruits no thyromegaly Lungs clear with  no wheezing and good diaphragmatic motion Heart:  S1/S2 SEM murmur, no rub, gallop or click PMI normal Abdomen: benighn, BS positve, no tenderness, no AAA no bruit.  No HSM or HJR Distal pulses intact with no bruits No edema Neuro non-focal Skin bruided in arms  No muscular weakness   Current Outpatient Prescriptions  Medication Sig Dispense Refill  . allopurinol (ZYLOPRIM) 100 MG tablet Take 100 mg by mouth 3 (three) times daily.      Marland Kitchen amLODipine (NORVASC) 10 MG tablet Take 5 mg by mouth daily.      Marland Kitchen aspirin EC 81 MG tablet Take 81 mg by mouth daily.      . Calcium Carbonate-Vitamin D (CALTRATE 600+D PO) Take 1 tablet by mouth 2 (two) times daily.      . cyanocobalamin 1000 MCG tablet Take 1,000 mcg by mouth daily.      . finasteride (PROSCAR) 5 MG tablet Take 5 mg by mouth daily.      . furosemide (LASIX) 20 MG tablet Take 1 tablet (20 mg total) by mouth daily. Get BMP and Potassium checked within 3-4 days by your Primary MD and dose of this medication adjusted  15 tablet  0  . gabapentin (NEURONTIN) 400 MG capsule Take 300 mg by mouth daily.       Marland Kitchen HYDROcodone-acetaminophen (NORCO/VICODIN) 5-325 MG per tablet Take 1 tablet by mouth 3 (three) times daily as needed for pain. For pain      . insulin NPH (HUMULIN  N,NOVOLIN N) 100 UNIT/ML injection Inject 28 Units into the skin at bedtime.      Marland Kitchen lisinopril (PRINIVIL,ZESTRIL) 10 MG tablet Take 5 mg by mouth 2 (two) times daily.       . mycophenolate (CELLCEPT) 250 MG capsule Take 500 mg by mouth 2 (two) times daily.       Marland Kitchen omeprazole (PRILOSEC) 20 MG capsule Take 20 mg by mouth daily.      . predniSONE (DELTASONE) 5 MG tablet Take 5 mg by mouth daily.      . simvastatin (ZOCOR) 40 MG tablet Take 20 mg by mouth every evening.      . tacrolimus (PROGRAF) 1 MG capsule Take 3 mg by mouth 2 (two) times daily.       Marland Kitchen terazosin (HYTRIN) 10 MG capsule Take 10 mg by mouth daily.        No current facility-administered medications for this  visit.    Allergies  Review of patient's allergies indicates no known allergies.  Electrocardiogram:  9/14  SR nonspecific ST changes   Assessment and Plan

## 2013-06-15 NOTE — Assessment & Plan Note (Signed)
Refer to pulmonary PFT;s  Needs flu shot  Quit smoking 15 years ago

## 2013-06-15 NOTE — Assessment & Plan Note (Signed)
Needs f/u carotid duplex No TIA symptoms ASA

## 2013-06-15 NOTE — Assessment & Plan Note (Signed)
Cr 1.3  proraf  F/u renal Should get established with local nephrologist as well

## 2013-06-15 NOTE — Assessment & Plan Note (Signed)
Will take terasozin at night instead of day to even things out.  Will f/u with nephrologist at [facility] for any further adjustments

## 2013-06-15 NOTE — Assessment & Plan Note (Signed)
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

## 2013-06-17 ENCOUNTER — Ambulatory Visit (INDEPENDENT_AMBULATORY_CARE_PROVIDER_SITE_OTHER): Payer: Medicare Other | Admitting: Internal Medicine

## 2013-06-17 ENCOUNTER — Ambulatory Visit (HOSPITAL_COMMUNITY): Payer: Medicare Other | Attending: Cardiology

## 2013-06-17 ENCOUNTER — Encounter: Payer: Self-pay | Admitting: Internal Medicine

## 2013-06-17 ENCOUNTER — Ambulatory Visit (INDEPENDENT_AMBULATORY_CARE_PROVIDER_SITE_OTHER)
Admission: RE | Admit: 2013-06-17 | Discharge: 2013-06-17 | Disposition: A | Discharge status: Home / Self Care | Payer: Medicare Other | Source: Ambulatory Visit | Attending: Internal Medicine | Admitting: Internal Medicine

## 2013-06-17 ENCOUNTER — Encounter (INDEPENDENT_AMBULATORY_CARE_PROVIDER_SITE_OTHER): Payer: Self-pay

## 2013-06-17 ENCOUNTER — Ambulatory Visit (INDEPENDENT_AMBULATORY_CARE_PROVIDER_SITE_OTHER): Payer: No Typology Code available for payment source | Admitting: Internal Medicine

## 2013-06-17 VITALS — BP 90/60 | HR 75 | Temp 97.8°F | Ht 72.0 in | Wt 221.0 lb

## 2013-06-17 DIAGNOSIS — I10 Essential (primary) hypertension: Secondary | ICD-10-CM | POA: Insufficient documentation

## 2013-06-17 DIAGNOSIS — R0989 Other specified symptoms and signs involving the circulatory and respiratory systems: Secondary | ICD-10-CM

## 2013-06-17 DIAGNOSIS — R059 Cough, unspecified: Secondary | ICD-10-CM

## 2013-06-17 DIAGNOSIS — R0600 Dyspnea, unspecified: Secondary | ICD-10-CM

## 2013-06-17 DIAGNOSIS — J449 Chronic obstructive pulmonary disease, unspecified: Secondary | ICD-10-CM | POA: Insufficient documentation

## 2013-06-17 DIAGNOSIS — J4489 Other specified chronic obstructive pulmonary disease: Secondary | ICD-10-CM

## 2013-06-17 DIAGNOSIS — E785 Hyperlipidemia, unspecified: Secondary | ICD-10-CM | POA: Insufficient documentation

## 2013-06-17 DIAGNOSIS — I6529 Occlusion and stenosis of unspecified carotid artery: Secondary | ICD-10-CM

## 2013-06-17 DIAGNOSIS — R0609 Other forms of dyspnea: Secondary | ICD-10-CM

## 2013-06-17 DIAGNOSIS — I509 Heart failure, unspecified: Secondary | ICD-10-CM

## 2013-06-17 DIAGNOSIS — I658 Occlusion and stenosis of other precerebral arteries: Secondary | ICD-10-CM | POA: Insufficient documentation

## 2013-06-17 DIAGNOSIS — E119 Type 2 diabetes mellitus without complications: Secondary | ICD-10-CM | POA: Insufficient documentation

## 2013-06-17 DIAGNOSIS — R05 Cough: Secondary | ICD-10-CM

## 2013-06-17 LAB — PULMONARY FUNCTION TEST

## 2013-06-17 NOTE — Assessment & Plan Note (Addendum)
-   pft's wnl 06/17/2013    Symptoms are markedly disproportionate to objective findings and not clear this is even a  lung problem but pt does appear to have difficult airway management issues. DDX of  difficult airways managment all start with A and  include Adherence, Ace Inhibitors, Acid Reflux, Active Sinus Disease, Alpha 1 Antitripsin deficiency, Anxiety masquerading as Airways dz,  ABPA,  allergy(esp in young), Aspiration (esp in elderly), Adverse effects of DPI,  Active smokers, plus two Bs  = Bronchiectasis and Beta blocker use..and one C= CHF  CHF / vol overload most likely explanation for abrupt symptoms assoc with leg swellling now resolved  ACEi probably contributing to mild cough  However this is not sign copd nor will it ever be so no pulmonary f/u needed

## 2013-06-17 NOTE — Progress Notes (Signed)
Quick Note:  Spoke with pt and notified of results per Dr. Wert. Pt verbalized understanding and denied any questions.  ______ 

## 2013-06-17 NOTE — Progress Notes (Signed)
PFT done today. 

## 2013-06-17 NOTE — Assessment & Plan Note (Addendum)
Classic Upper airway cough syndrome, so named because it's frequently impossible to sort out how much is  CR/sinusitis with freq throat clearing (which can be related to primary GERD)   vs  causing  secondary (" extra esophageal")  GERD from wide swings in gastric pressure that occur with throat clearing, often  promoting self use of mint and menthol lozenges that reduce the lower esophageal sphincter tone and exacerbate the problem further in a cyclical fashion.   These are the same pts (now being labeled as having "irritable larynx syndrome" by some cough centers) who not infrequently have a history of having failed to tolerate ace inhibitors,  dry powder inhalers or biphosphonates or report having atypical reflux symptoms that don't respond to standard doses of PPI , and are easily confused as having aecopd or asthma flares by even experienced allergists/ pulmonologists.  Consider d/c acei and substitute arb if becomes more symptomatic

## 2013-06-17 NOTE — Assessment & Plan Note (Signed)
Resolved by cxr > f/u Cardiology

## 2013-06-17 NOTE — Progress Notes (Signed)
Subjective:    Patient ID: Jon Burns, male    DOB: [birth year]  MRN: [id number]  HPI  80 yowm quit smoking 1994 because his wife nagged him with no resp problems but limited by feet chronically then abrupt onst sob admit to [facility] with dx of copd   Admission date: 05/16/2013 Admitting Physician Laveda Norman, MD  Discharge Date: 05/18/2013  Primary MD Marin Comment, FNP  Admission Diagnosis COPD (chronic obstructive pulmonary disease) [496]  Chronic cough [786.2]  CHF (congestive heart failure) [428.0]  History of renal transplant [V42.0]  Diabetes mellitus [250.00]  Discharge Diagnosis  Active Problems:  CHF (congestive heart failure)  HTN (hypertension)  Diabetes mellitus  Dyslipidemia  Chronic cough  History of renal transplantation  GERD (gastroesophageal reflux disease)  BPH (benign prostatic hyperplasia)    06/17/2013 1st [facility] office visit/ Wert  cc breathing better since got fluid off > no longer swelling,  Coughing on acei x since April 2014, dry, assoc with urge to clear throat and hoarsness but not bothering him at hs.  Not limited by breathing before abrupt onset prior to above admit and not limiting him since with complete resolution of his leg swelling also.  No obvious day to day or daytime variabilty or assoc   cp or chest tightness, subjective wheeze overt sinus or hb symptoms. No unusual exp hx or h/o childhood pna/ asthma or knowledge of premature birth.  Sleeping ok without nocturnal  or early am exacerbation  of respiratory  c/o's or need for noct saba. Also denies any obvious fluctuation of symptoms with weather or environmental changes or other aggravating or alleviating factors except as outlined above   Current Medications, Allergies, Complete Past Medical History, Past Surgical History, Family History, and Social History were reviewed in [employer] record.      Review of Systems  Constitutional: Negative for fever,  chills, activity change, appetite change and unexpected weight change.  HENT: Negative for congestion, dental problem, postnasal drip, rhinorrhea, sneezing, sore throat, trouble swallowing and voice change.   Eyes: Negative for visual disturbance.  Respiratory: Positive for cough and shortness of breath. Negative for choking.   Cardiovascular: Negative for chest pain and leg swelling.  Gastrointestinal: Negative for nausea, vomiting and abdominal pain.  Genitourinary: Negative for difficulty urinating.       Indigestion  Musculoskeletal: Negative for arthralgias.  Skin: Negative for rash.  Psychiatric/Behavioral: Negative for behavioral problems and confusion.       Objective:   Physical Exam  Pleasant amb wm slt hoarse nad Wt Readings from Last 3 Encounters:  06/17/13 221 lb (100.245 kg)  06/15/13 218 lb (98.884 kg)  05/18/13 223 lb 12.8 oz (101.515 kg)      HEENT: nl dentition, turbinates, and orophanx. Nl external ear canals without cough reflex   NECK :  without JVD/Nodes/TM/ nl carotid upstrokes bilaterally   LUNGS: no acc muscle use, clear to A and P bilaterally without cough on insp or exp maneuvers   CV:  RRR  no s3 or murmur or increase in P2, no edema   ABD:  soft and nontender with nl excursion in the supine position. No bruits or organomegaly, bowel sounds nl  MS:  warm without deformities, calf tenderness, cyanosis or clubbing  SKIN: warm and dry without lesions    NEURO:  alert, approp, no deficits    CXR  06/17/2013 :   Negative for pleural fusion. No acute disease.     Assessment &  Plan:

## 2013-06-17 NOTE — Patient Instructions (Addendum)
You do not have significant lung damage from smoking and you never will based on your present lung function  Your lisinopril may be making you clear your throat / cough/ hoarse but it's not severe and can be addressed later by whoever prescribes it.  Please remember to go to the   x-ray department downstairs for your tests - we will call you with the results when they are available.

## 2013-06-22 ENCOUNTER — Other Ambulatory Visit: Payer: Self-pay | Admitting: *Deleted

## 2013-06-22 DIAGNOSIS — I6522 Occlusion and stenosis of left carotid artery: Secondary | ICD-10-CM

## 2013-06-26 ENCOUNTER — Other Ambulatory Visit: Payer: Self-pay | Admitting: Internal Medicine

## 2013-06-28 ENCOUNTER — Ambulatory Visit (INDEPENDENT_AMBULATORY_CARE_PROVIDER_SITE_OTHER)
Admission: RE | Admit: 2013-06-28 | Discharge: 2013-06-28 | Disposition: A | Discharge status: Home / Self Care | Payer: Medicare Other | Source: Ambulatory Visit | Attending: Cardiovascular Disease | Admitting: Cardiovascular Disease

## 2013-06-28 ENCOUNTER — Telehealth: Payer: Self-pay | Admitting: Cardiovascular Disease

## 2013-06-28 DIAGNOSIS — I6529 Occlusion and stenosis of unspecified carotid artery: Secondary | ICD-10-CM

## 2013-06-28 DIAGNOSIS — I6522 Occlusion and stenosis of left carotid artery: Secondary | ICD-10-CM

## 2013-06-28 MED ORDER — IOHEXOL 350 MG/ML SOLN
80.0000 mL | Freq: Once | INTRAVENOUS | Status: AC | PRN
  Administered 2013-06-28: 80 mL via INTRAVENOUS

## 2013-06-29 ENCOUNTER — Encounter: Payer: Self-pay | Admitting: Internal Medicine

## 2013-06-29 DIAGNOSIS — R911 Solitary pulmonary nodule: Secondary | ICD-10-CM | POA: Insufficient documentation

## 2013-07-01 ENCOUNTER — Other Ambulatory Visit: Payer: Self-pay | Admitting: *Deleted

## 2013-07-01 DIAGNOSIS — R946 Abnormal results of thyroid function studies: Secondary | ICD-10-CM

## 2013-07-01 DIAGNOSIS — E079 Disorder of thyroid, unspecified: Secondary | ICD-10-CM

## 2013-07-02 NOTE — Telephone Encounter (Signed)
New Problem  Pt returning a call for results and the appointment for Endocrinologist and Pulmonary// please call.

## 2013-07-02 NOTE — Telephone Encounter (Signed)
SCHEDULERS TO CALL WITH  APPTS. PT'S WIFE  NOTIFIED/CY

## 2013-07-06 ENCOUNTER — Encounter: Payer: Self-pay | Admitting: Internal Medicine

## 2013-07-06 ENCOUNTER — Other Ambulatory Visit: Payer: Self-pay | Admitting: Internal Medicine

## 2013-07-06 ENCOUNTER — Ambulatory Visit (INDEPENDENT_AMBULATORY_CARE_PROVIDER_SITE_OTHER): Payer: Medicare Other | Admitting: Internal Medicine

## 2013-07-06 VITALS — BP 92/60 | HR 66 | Temp 97.8°F | Ht 73.0 in | Wt 225.0 lb

## 2013-07-06 DIAGNOSIS — R0600 Dyspnea, unspecified: Secondary | ICD-10-CM

## 2013-07-06 DIAGNOSIS — I10 Essential (primary) hypertension: Secondary | ICD-10-CM

## 2013-07-06 DIAGNOSIS — R911 Solitary pulmonary nodule: Secondary | ICD-10-CM

## 2013-07-06 DIAGNOSIS — R0609 Other forms of dyspnea: Secondary | ICD-10-CM

## 2013-07-06 NOTE — Assessment & Plan Note (Signed)
Adequate control on present rx, reviewed > no change in rx needed unless hoarseness or cough worsen in which case he'll need a trial off acei

## 2013-07-06 NOTE — Progress Notes (Signed)
Subjective:    Patient ID: Jon Burns, male    DOB: 1941/05/24  MRN: [id number]  Brief patient profile:  32 yowm quit smoking 1994 because his wife nagged him with no resp problems but limited by feet chronically then abrupt onst sob admit to [facility] with dx of copd but had nl pft's 06/17/13    History of Present Illness  Admission date: 05/16/2013 Admitting Physician Laveda Norman, MD  Discharge Date: 05/18/2013  Primary MD Marin Comment, FNP  Admission Diagnosis COPD (chronic obstructive pulmonary disease) [496]  Chronic cough [786.2]  CHF (congestive heart failure) [428.0]  History of renal transplant [V42.0]  Diabetes mellitus [250.00]  Discharge Diagnosis  Active Problems:  CHF (congestive heart failure)  HTN (hypertension)  Diabetes mellitus  Dyslipidemia  Chronic cough  History of renal transplantation  GERD (gastroesophageal reflux disease)  BPH (benign prostatic hyperplasia)    06/17/2013 1st [facility] office visit/ Jon Burns  cc breathing better since got fluid off > no longer swelling,  Coughing on acei x since April 2014, dry, assoc with urge to clear throat and hoarsness but not bothering him at hs.  Not limited by breathing before abrupt onset prior to above admit and not limiting him since with complete resolution of his leg swelling also. rec You do not have significant lung damage from smoking and you never will based on your present lung function Your lisinopril may be making you clear your throat / cough/ hoarse but it's not severe and can be addressed later by whoever prescribes it.    07/06/2013 f/u ov/Jon Burns re: f/u pulmonary nodule Chief Complaint  Patient presents with  . Follow-up    Pt states referred per Dr Eden Emms for eval of lung lesion noted on recent ct chest. His breathing is doing well and he denies any co's today.      Now able to walk at [employer],  Slow pace. No need for resp meds  No obvious day to day or daytime variabilty or assoc chronic  cough or cp or chest tightness, subjective wheeze overt sinus or hb symptoms. No unusual exp hx or h/o childhood pna/ asthma or knowledge of premature birth.  Sleeping ok without nocturnal  or early am exacerbation  of respiratory  c/o's or need for noct saba. Also denies any obvious fluctuation of symptoms with weather or environmental changes or other aggravating or alleviating factors except as outlined above   Current Medications, Allergies, Complete Past Medical History, Past Surgical History, Family History, and Social History were reviewed in [employer] record.  ROS  The following are not active complaints unless bolded sore throat, dysphagia, dental problems, itching, sneezing,  nasal congestion or excess/ purulent secretions, ear ache,   fever, chills, sweats, unintended wt loss, pleuritic or exertional cp, hemoptysis,  orthopnea pnd or leg swelling, presyncope, palpitations, heartburn, abdominal pain, anorexia, nausea, vomiting, diarrhea  or change in bowel or urinary habits, change in stools or urine, dysuria,hematuria,  rash, arthralgias, visual complaints, headache, numbness weakness or ataxia or problems with walking or coordination,  change in mood/affect or memory.               Objective:   Physical Exam  Pleasant amb wm  Raspy voice, occ cough with voice use or deep breath   07/06/2013        225  Wt Readings from Last 3 Encounters:  06/17/13 221 lb (100.245 kg)  06/15/13 218 lb (98.884 kg)  05/18/13 223 lb 12.8  oz (101.515 kg)      HEENT: nl dentition, turbinates, and orophanx. Nl external ear canals without cough reflex   NECK :  without JVD/Nodes/TM/ nl carotid upstrokes bilaterally   LUNGS: no acc muscle use, clear to A and P bilaterally without cough on insp or exp maneuvers   CV:  RRR  no s3 or murmur or increase in P2, no edema   ABD:  soft and nontender with nl excursion in the supine position. No bruits or organomegaly, bowel  sounds nl  MS:  warm without deformities, calf tenderness, cyanosis or clubbing  SKIN: warm and dry without lesions         CXR  06/17/2013 :   Negative for pleural effusion. No acute disease.       Assessment & Plan:

## 2013-07-06 NOTE — Progress Notes (Signed)
Quick Note:  Order sent to [facility] for ct chest in 3 months ______

## 2013-07-06 NOTE — Patient Instructions (Addendum)
We will set up a follow up CT Chest in 3 months - call sooner if any problems  If raspy voice or cough worsen then will need reconsider replacing the lisniopril

## 2013-07-06 NOTE — Assessment & Plan Note (Signed)
-   pft's wnl 06/17/2013   Much improved with improvement in vol status and does have persistent mild cough but no limiting sob so no need for pulmonary rx

## 2013-07-06 NOTE — Assessment & Plan Note (Signed)
-   CT neck 06/28/13 8 x 12 mm left upper lobe > tickle file for f/u in 3 months  He is a remote smoker so risk is low but clearly not zero  Discussed in detail all the  indications, usual  risks and alternatives  relative to the benefits with patient who agrees to proceed with whole lung ct s contrast  @ 3 months from last CT

## 2013-07-07 ENCOUNTER — Telehealth: Payer: Self-pay | Admitting: Cardiovascular Disease

## 2013-07-07 ENCOUNTER — Encounter: Payer: Self-pay | Admitting: Endocrinology

## 2013-07-07 ENCOUNTER — Ambulatory Visit (INDEPENDENT_AMBULATORY_CARE_PROVIDER_SITE_OTHER): Payer: Medicare Other | Admitting: Endocrinology

## 2013-07-07 VITALS — BP 122/72 | HR 74 | Wt 221.0 lb

## 2013-07-07 DIAGNOSIS — E041 Nontoxic single thyroid nodule: Secondary | ICD-10-CM

## 2013-07-07 NOTE — Progress Notes (Signed)
Subjective:    Patient ID: Jon Burns, male    DOB: 12-25-1940, 72 y.o.   MRN: [id number]  HPI In October of 2014, pt was incidentally noted on CT of the neck, to have a nodule in the thyroid.  He has little if any pain there, and no assoc dysphagia.   Past Medical History  Diagnosis Date  . Hypertension   . Diabetes mellitus without complication   . CHF (congestive heart failure)   . Hypercholesteremia   . 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 Kidney transplant Right 05/14/2010  . Av fistula placement Left July 2011  . Nephrectomy transplanted organ    . Hernia repair    . Rotator cuff repair      History   Social History  . Marital Status: Married    Spouse Name: N/A    Number of Children: N/A  . Years of Education: N/A   Occupational History  . Not on file.   Social History Main Topics  . Smoking status: Former Smoker -- 1.50 packs/day for 40 years    Types: Cigarettes    Quit date: 09/02/1992  . Smokeless tobacco: Never Used  . Alcohol Use: No  . Drug Use: No  . Sexual Activity: Not on file   Other Topics Concern  . Not on file   Social History Narrative  . No narrative on file    Current Outpatient Prescriptions on File Prior to Visit  Medication Sig Dispense Refill  . allopurinol (ZYLOPRIM) 100 MG tablet Take 100 mg by mouth 3 (three) times daily.      Marland Kitchen aspirin EC 81 MG tablet Take 81 mg by mouth daily.      . Calcium Carbonate-Vitamin D (CALTRATE 600+D PO) Take 1 tablet by mouth 2 (two) times daily.      . cyanocobalamin 1000 MCG tablet Take 1,000 mcg by mouth daily.      . finasteride (PROSCAR) 5 MG tablet Take 5 mg by mouth daily.      Marland Kitchen gabapentin (NEURONTIN) 300 MG capsule Take 300 mg by mouth daily.      Marland Kitchen HYDROcodone-acetaminophen (NORCO/VICODIN) 5-325 MG per tablet Take 1 tablet by mouth 3 (three) times daily as needed for pain. For pain      . insulin NPH (HUMULIN N,NOVOLIN  N) 100 UNIT/ML injection Inject 28 Units into the skin at bedtime.      Marland Kitchen lisinopril (PRINIVIL,ZESTRIL) 10 MG tablet Take 5 mg by mouth 2 (two) times daily.       . mycophenolate (CELLCEPT) 250 MG capsule Take 500 mg by mouth 2 (two) times daily.       Marland Kitchen omeprazole (PRILOSEC) 20 MG capsule Take 20 mg by mouth daily.      . predniSONE (DELTASONE) 5 MG tablet Take 5 mg by mouth daily.      . simvastatin (ZOCOR) 40 MG tablet Take 20 mg by mouth every evening.      . tacrolimus (PROGRAF) 1 MG capsule Take 3 mg by mouth 2 (two) times daily.       Marland Kitchen terazosin (HYTRIN) 10 MG capsule Take 10 mg by mouth daily.       Marland Kitchen amLODipine (NORVASC) 10 MG tablet Take 5 mg by mouth daily.       No current facility-administered medications on file prior to visit.   No Known Allergies  Family History  Problem Relation  Age of Onset  . Cancer Mother     unsure of type  no goiter or other thyroid problem.    BP 122/72  Pulse 74  Wt 221 lb (100.245 kg)  SpO2 97%  Review of Systems Sob is much better.  denies depression, cramps, memory loss, constipation, numbness, blurry vision, myalgias, rhinorrhea, easy bruising, and syncope.  He has fatigue, memory loss, weight loss (due to diuretic rx, and his efforts) and dry skin.    Objective:   Physical Exam VS: see vs page GEN: no distress HEAD: head: no deformity eyes: no periorbital swelling, no proptosis external nose and ears are normal mouth: no lesion seen NECK: supple, thyroid is not enlarged.  i cannot feel the thyroid nodule CHEST WALL: no deformity LUNGS: clear to auscultation BREASTS:  No gynecomastia CV: reg rate and rhythm; systolic murmur is noted.   ABD: abdomen is soft, nontender.  no hepatosplenomegaly.  not distended.  no hernia.   MUSCULOSKELETAL: muscle bulk and strength are grossly normal.  no obvious joint swelling.  gait is normal and steady.   EXTEMITIES: no deformity.  no ulcer on the feet.  feet are of normal color and temp.  no  edema PULSES: bilat carotid bruits (vs transmitted murmur) NEURO:  cn 2-12 grossly intact.   readily moves all 4's.  sensation is intact to touch on all 4's. SKIN:  Normal texture and temperature.  No rash or suspicious lesion is visible.   NODES:  None palpable at the neck PSYCH: alert, oriented x3.  Does not appear anxious nor depressed.   Lab Results  Component Value Date   TSH 0.882 05/16/2013   (i reviewed CT report)    Assessment & Plan:  Thyroid nodule, uncertain etiology:  We discussed the possibility of bx.  After discussion, pt declines. CHF: given this and other comorbidities, i support pt's decision.  Memory loss: not thyroid-related

## 2013-07-07 NOTE — Patient Instructions (Signed)
Let's check the ultrasound. If the nodule is confirmed, please return in 1 year. Let me know if you change your mind and decide to have the biopsy.

## 2013-07-07 NOTE — Telephone Encounter (Signed)
New message    Pt had kidney transplant 72yrs ago---[facility] want [place] to fax ct scan report---pls fax to [phone] attn Particia Nearing

## 2013-07-09 NOTE — Telephone Encounter (Signed)
PT 'S  WIFE  AWARE   CT  SCAN FAXED TO  STATED  NUMBER .Jon Burns

## 2013-07-12 ENCOUNTER — Ambulatory Visit
Admission: RE | Admit: 2013-07-12 | Discharge: 2013-07-12 | Disposition: A | Discharge status: Home / Self Care | Payer: Non-veteran care | Source: Ambulatory Visit | Attending: Endocrinology | Admitting: Endocrinology

## 2013-08-26 ENCOUNTER — Inpatient Hospital Stay (HOSPITAL_COMMUNITY)
Admission: EM | Admit: 2013-08-26 | Discharge: 2013-08-29 | DRG: 291 | Disposition: A | Discharge status: Home / Self Care | Payer: Medicare Other | Attending: Internal Medicine | Admitting: Internal Medicine

## 2013-08-26 ENCOUNTER — Emergency Department (HOSPITAL_COMMUNITY): Payer: Medicare Other

## 2013-08-26 ENCOUNTER — Encounter (HOSPITAL_COMMUNITY): Payer: Self-pay | Admitting: Emergency Medicine

## 2013-08-26 DIAGNOSIS — E785 Hyperlipidemia, unspecified: Secondary | ICD-10-CM

## 2013-08-26 DIAGNOSIS — I5033 Acute on chronic diastolic (congestive) heart failure: Principal | ICD-10-CM | POA: Diagnosis present

## 2013-08-26 DIAGNOSIS — R0989 Other specified symptoms and signs involving the circulatory and respiratory systems: Secondary | ICD-10-CM

## 2013-08-26 DIAGNOSIS — J811 Chronic pulmonary edema: Secondary | ICD-10-CM

## 2013-08-26 DIAGNOSIS — K219 Gastro-esophageal reflux disease without esophagitis: Secondary | ICD-10-CM

## 2013-08-26 DIAGNOSIS — I429 Cardiomyopathy, unspecified: Secondary | ICD-10-CM

## 2013-08-26 DIAGNOSIS — I509 Heart failure, unspecified: Secondary | ICD-10-CM

## 2013-08-26 DIAGNOSIS — Z794 Long term (current) use of insulin: Secondary | ICD-10-CM

## 2013-08-26 DIAGNOSIS — N19 Unspecified kidney failure: Secondary | ICD-10-CM

## 2013-08-26 DIAGNOSIS — I10 Essential (primary) hypertension: Secondary | ICD-10-CM

## 2013-08-26 DIAGNOSIS — J9600 Acute respiratory failure, unspecified whether with hypoxia or hypercapnia: Secondary | ICD-10-CM

## 2013-08-26 DIAGNOSIS — E041 Nontoxic single thyroid nodule: Secondary | ICD-10-CM

## 2013-08-26 DIAGNOSIS — I501 Left ventricular failure: Secondary | ICD-10-CM

## 2013-08-26 DIAGNOSIS — Z79899 Other long term (current) drug therapy: Secondary | ICD-10-CM

## 2013-08-26 DIAGNOSIS — I259 Chronic ischemic heart disease, unspecified: Secondary | ICD-10-CM

## 2013-08-26 DIAGNOSIS — R0600 Dyspnea, unspecified: Secondary | ICD-10-CM

## 2013-08-26 DIAGNOSIS — Z940 Kidney transplant status: Secondary | ICD-10-CM

## 2013-08-26 DIAGNOSIS — R053 Chronic cough: Secondary | ICD-10-CM

## 2013-08-26 DIAGNOSIS — J9690 Respiratory failure, unspecified, unspecified whether with hypoxia or hypercapnia: Secondary | ICD-10-CM

## 2013-08-26 DIAGNOSIS — I5031 Acute diastolic (congestive) heart failure: Secondary | ICD-10-CM

## 2013-08-26 DIAGNOSIS — R05 Cough: Secondary | ICD-10-CM

## 2013-08-26 DIAGNOSIS — I5021 Acute systolic (congestive) heart failure: Secondary | ICD-10-CM | POA: Diagnosis present

## 2013-08-26 DIAGNOSIS — N400 Enlarged prostate without lower urinary tract symptoms: Secondary | ICD-10-CM

## 2013-08-26 DIAGNOSIS — Z7982 Long term (current) use of aspirin: Secondary | ICD-10-CM

## 2013-08-26 DIAGNOSIS — E119 Type 2 diabetes mellitus without complications: Secondary | ICD-10-CM

## 2013-08-26 DIAGNOSIS — R911 Solitary pulmonary nodule: Secondary | ICD-10-CM

## 2013-08-26 DIAGNOSIS — J810 Acute pulmonary edema: Secondary | ICD-10-CM

## 2013-08-26 DIAGNOSIS — Z87891 Personal history of nicotine dependence: Secondary | ICD-10-CM

## 2013-08-26 DIAGNOSIS — I2510 Atherosclerotic heart disease of native coronary artery without angina pectoris: Secondary | ICD-10-CM | POA: Diagnosis present

## 2013-08-26 HISTORY — DX: Shortness of breath: R06.02

## 2013-08-26 HISTORY — DX: Type 2 diabetes mellitus without complications: E11.9

## 2013-08-26 HISTORY — DX: Gout, unspecified: M10.9

## 2013-08-26 HISTORY — DX: Gastro-esophageal reflux disease without esophagitis: K21.9

## 2013-08-26 HISTORY — DX: Sleep apnea, unspecified: G47.30

## 2013-08-26 HISTORY — DX: Pneumonia, unspecified organism: J18.9

## 2013-08-26 HISTORY — DX: Cardiac murmur, unspecified: R01.1

## 2013-08-26 LAB — POCT I-STAT 3, VENOUS BLOOD GAS (G3P V)
Bicarbonate: 26.8 mEq/L — ABNORMAL HIGH (ref 20.0–24.0)
O2 Saturation: 82 %
pCO2, Ven: 45 mmHg (ref 45.0–50.0)
pH, Ven: 7.383 — ABNORMAL HIGH (ref 7.250–7.300)

## 2013-08-26 LAB — POCT I-STAT TROPONIN I: Troponin i, poc: 0.08 ng/mL (ref 0.00–0.08)

## 2013-08-26 LAB — BASIC METABOLIC PANEL
BUN: 33 mg/dL — ABNORMAL HIGH (ref 6–23)
CO2: 24 mEq/L (ref 19–32)
Calcium: 8.4 mg/dL (ref 8.4–10.5)
Creatinine, Ser: 1.72 mg/dL — ABNORMAL HIGH (ref 0.50–1.35)
GFR calc Af Amer: 45 mL/min — ABNORMAL LOW (ref >90)
GFR calc non Af Amer: 39 mL/min — ABNORMAL LOW (ref >90)
Sodium: 139 mEq/L (ref 135–145)

## 2013-08-26 LAB — CBC WITH DIFFERENTIAL/PLATELET
Basophils Absolute: 0 10*3/uL (ref 0.0–0.1)
Basophils Relative: 0 % (ref 0–1)
Eosinophils Absolute: 0 10*3/uL (ref 0.0–0.7)
Eosinophils Relative: 1 % (ref 0–5)
MCH: 30.3 pg (ref 26.0–34.0)
MCHC: 32.5 g/dL (ref 30.0–36.0)
Monocytes Relative: 5 % (ref 3–12)
Neutrophils Relative %: 81 % — ABNORMAL HIGH (ref 43–77)
Platelets: 122 10*3/uL — ABNORMAL LOW (ref 150–400)
RDW: 15.5 % (ref 11.5–15.5)

## 2013-08-26 LAB — CREATININE, SERUM
Creatinine, Ser: 1.54 mg/dL — ABNORMAL HIGH (ref 0.50–1.35)
GFR calc Af Amer: 51 mL/min — ABNORMAL LOW (ref >90)
GFR calc non Af Amer: 44 mL/min — ABNORMAL LOW (ref >90)

## 2013-08-26 LAB — CBC
HCT: 33 % — ABNORMAL LOW (ref 39.0–52.0)
Hemoglobin: 10.8 g/dL — ABNORMAL LOW (ref 13.0–17.0)
MCHC: 32.7 g/dL (ref 30.0–36.0)
Platelets: 112 10*3/uL — ABNORMAL LOW (ref 150–400)
RDW: 15.5 % (ref 11.5–15.5)
WBC: 8.3 10*3/uL (ref 4.0–10.5)

## 2013-08-26 LAB — GLUCOSE, CAPILLARY
Glucose-Capillary: 132 mg/dL — ABNORMAL HIGH (ref 70–99)
Glucose-Capillary: 124 mg/dL — ABNORMAL HIGH (ref 70–99)

## 2013-08-26 LAB — TROPONIN I
Troponin I: 1.57 ng/mL (ref <0.30)
Troponin I: 1.17 ng/mL (ref <0.30)

## 2013-08-26 LAB — PRO B NATRIURETIC PEPTIDE: Pro B Natriuretic peptide (BNP): 3270 pg/mL — ABNORMAL HIGH (ref 0–125)

## 2013-08-26 MED ORDER — PREDNISONE 5 MG PO TABS
5.0000 mg | ORAL_TABLET | Freq: Every day | ORAL | Status: DC
  Administered 2013-08-26 – 2013-08-29 (×4): 5 mg via ORAL
  Filled 2013-08-26 (×4): qty 1

## 2013-08-26 MED ORDER — TACROLIMUS 1 MG PO CAPS
3.0000 mg | ORAL_CAPSULE | Freq: Two times a day (BID) | ORAL | Status: DC
  Administered 2013-08-26 – 2013-08-29 (×7): 3 mg via ORAL
  Filled 2013-08-26 (×8): qty 3

## 2013-08-26 MED ORDER — HEPARIN SODIUM (PORCINE) 5000 UNIT/ML IJ SOLN
5000.0000 [IU] | Freq: Three times a day (TID) | INTRAMUSCULAR | Status: DC
  Administered 2013-08-26: 5000 [IU] via SUBCUTANEOUS
  Filled 2013-08-26 (×4): qty 1

## 2013-08-26 MED ORDER — FINASTERIDE 5 MG PO TABS
5.0000 mg | ORAL_TABLET | Freq: Every day | ORAL | Status: DC
  Administered 2013-08-26 – 2013-08-29 (×4): 5 mg via ORAL
  Filled 2013-08-26 (×4): qty 1

## 2013-08-26 MED ORDER — SIMVASTATIN 20 MG PO TABS
20.0000 mg | ORAL_TABLET | Freq: Every evening | ORAL | Status: DC
  Administered 2013-08-26 – 2013-08-28 (×3): 20 mg via ORAL
  Filled 2013-08-26 (×4): qty 1

## 2013-08-26 MED ORDER — ASPIRIN EC 81 MG PO TBEC
81.0000 mg | DELAYED_RELEASE_TABLET | Freq: Every day | ORAL | Status: DC
  Administered 2013-08-26 – 2013-08-29 (×4): 81 mg via ORAL
  Filled 2013-08-26 (×4): qty 1

## 2013-08-26 MED ORDER — ASPIRIN 300 MG RE SUPP: 300.0000 mg | Freq: Once | RECTAL | Status: DC

## 2013-08-26 MED ORDER — PANTOPRAZOLE SODIUM 40 MG PO TBEC
40.0000 mg | DELAYED_RELEASE_TABLET | Freq: Every day | ORAL | Status: DC
  Administered 2013-08-26 – 2013-08-29 (×4): 40 mg via ORAL
  Filled 2013-08-26 (×4): qty 1

## 2013-08-26 MED ORDER — INSULIN NPH (HUMAN) (ISOPHANE) 100 UNIT/ML ~~LOC~~ SUSP
28.0000 [IU] | Freq: Every day | SUBCUTANEOUS | Status: DC
  Administered 2013-08-26 – 2013-08-28 (×3): 28 [IU] via SUBCUTANEOUS
  Filled 2013-08-26 (×2): qty 10

## 2013-08-26 MED ORDER — LISINOPRIL 5 MG PO TABS
5.0000 mg | ORAL_TABLET | Freq: Two times a day (BID) | ORAL | Status: DC
  Administered 2013-08-26 – 2013-08-29 (×6): 5 mg via ORAL
  Filled 2013-08-26 (×8): qty 1

## 2013-08-26 MED ORDER — NITROGLYCERIN IN D5W 200-5 MCG/ML-% IV SOLN
2.0000 ug/min | INTRAVENOUS | Status: DC
  Administered 2013-08-26: 5 ug/min via INTRAVENOUS

## 2013-08-26 MED ORDER — FUROSEMIDE 10 MG/ML IJ SOLN
80.0000 mg | Freq: Once | INTRAMUSCULAR | Status: AC
  Administered 2013-08-26: 80 mg via INTRAVENOUS
  Filled 2013-08-26: qty 8

## 2013-08-26 MED ORDER — SODIUM CHLORIDE 0.9 % IJ SOLN
3.0000 mL | Freq: Two times a day (BID) | INTRAMUSCULAR | Status: DC
  Administered 2013-08-26: 12:00:00 via INTRAVENOUS
  Administered 2013-08-26: 3 mL via INTRAVENOUS
  Administered 2013-08-26: 12:00:00 via INTRAVENOUS
  Administered 2013-08-27 – 2013-08-28 (×2): 3 mL via INTRAVENOUS

## 2013-08-26 MED ORDER — HYDROCODONE-ACETAMINOPHEN 5-325 MG PO TABS
1.0000 | ORAL_TABLET | ORAL | Status: DC | PRN
  Administered 2013-08-27: 1 via ORAL
  Filled 2013-08-26: qty 1

## 2013-08-26 MED ORDER — VITAMIN B-12 1000 MCG PO TABS
1000.0000 ug | ORAL_TABLET | Freq: Every day | ORAL | Status: DC
  Administered 2013-08-26 – 2013-08-29 (×4): 1000 ug via ORAL
  Filled 2013-08-26 (×4): qty 1

## 2013-08-26 MED ORDER — NITROGLYCERIN IN D5W 200-5 MCG/ML-% IV SOLN
INTRAVENOUS | Status: AC
  Filled 2013-08-26: qty 250

## 2013-08-26 MED ORDER — INSULIN ASPART 100 UNIT/ML ~~LOC~~ SOLN
0.0000 [IU] | Freq: Three times a day (TID) | SUBCUTANEOUS | Status: DC
  Administered 2013-08-27: 1 [IU] via SUBCUTANEOUS
  Administered 2013-08-27: 2 [IU] via SUBCUTANEOUS
  Administered 2013-08-27: 5 [IU] via SUBCUTANEOUS
  Administered 2013-08-28: 1 [IU] via SUBCUTANEOUS
  Administered 2013-08-28: 3 [IU] via SUBCUTANEOUS

## 2013-08-26 MED ORDER — SODIUM CHLORIDE 0.9 % IJ SOLN
3.0000 mL | Freq: Two times a day (BID) | INTRAMUSCULAR | Status: DC
  Administered 2013-08-27 – 2013-08-28 (×2): 3 mL via INTRAVENOUS

## 2013-08-26 MED ORDER — HEPARIN SODIUM (PORCINE) 5000 UNIT/ML IJ SOLN
5000.0000 [IU] | Freq: Three times a day (TID) | INTRAMUSCULAR | Status: DC
  Administered 2013-08-27 – 2013-08-29 (×5): 5000 [IU] via SUBCUTANEOUS
  Filled 2013-08-26 (×8): qty 1

## 2013-08-26 MED ORDER — SODIUM CHLORIDE 0.9 % IJ SOLN: 3.0000 mL | INTRAMUSCULAR | Status: DC | PRN

## 2013-08-26 MED ORDER — GABAPENTIN 300 MG PO CAPS
300.0000 mg | ORAL_CAPSULE | Freq: Every day | ORAL | Status: DC
  Administered 2013-08-26 – 2013-08-29 (×4): 300 mg via ORAL
  Filled 2013-08-26 (×4): qty 1

## 2013-08-26 MED ORDER — SODIUM CHLORIDE 0.9 % IV SOLN: 250.0000 mL | INTRAVENOUS | Status: DC | PRN

## 2013-08-26 MED ORDER — TERAZOSIN HCL 5 MG PO CAPS
10.0000 mg | ORAL_CAPSULE | Freq: Every day | ORAL | Status: DC
  Administered 2013-08-26 – 2013-08-29 (×4): 10 mg via ORAL
  Filled 2013-08-26 (×4): qty 2

## 2013-08-26 MED ORDER — ALLOPURINOL 100 MG PO TABS
100.0000 mg | ORAL_TABLET | Freq: Three times a day (TID) | ORAL | Status: DC
  Administered 2013-08-26 – 2013-08-27 (×4): 100 mg via ORAL
  Filled 2013-08-26 (×6): qty 1

## 2013-08-26 MED ORDER — ASPIRIN 300 MG RE SUPP
300.0000 mg | Freq: Once | RECTAL | Status: AC
  Administered 2013-08-26: 300 mg via RECTAL
  Filled 2013-08-26: qty 1

## 2013-08-26 MED ORDER — MYCOPHENOLATE MOFETIL 250 MG PO CAPS
500.0000 mg | ORAL_CAPSULE | Freq: Two times a day (BID) | ORAL | Status: DC
  Administered 2013-08-26 – 2013-08-29 (×7): 500 mg via ORAL
  Filled 2013-08-26 (×8): qty 2

## 2013-08-26 MED ORDER — INSULIN ASPART 100 UNIT/ML ~~LOC~~ SOLN
0.0000 [IU] | SUBCUTANEOUS | Status: DC
  Administered 2013-08-26 (×2): 1 [IU] via SUBCUTANEOUS

## 2013-08-26 NOTE — ED Provider Notes (Signed)
CSN: [id number]     Arrival date & time 08/26/13  1478 History   First MD Initiated Contact with Patient 08/26/13 0446     Chief Complaint  Patient presents with  . Respiratory Distress   (Consider location/radiation/quality/duration/timing/severity/associated sxs/prior Treatment) HPI This patient is a 72 yo man with a history congestive heart failure, HTN, DM and s/p kidney transplant.   He is BIB EMS from his home with about 1-2 hrs of increasingly severe SOB He felt well when he went to sleep but awoke at 0330 and complained to his wife of feeling SOB. His work of breathing progressively increased and his wife called EMS around 0430. Marland Kitchen The pt has a mild chronic cough. He denies chest pain. No fever. He was started on BiPAP by EMS.  Says he feels somewhat better with BiPAP. Reports compliance with all medications and a low sodium diet. He takes Lasix only on a prn basis and took  Dose pta when he developed SOB. He has not yet voided.   Past Medical History  Diagnosis Date  . Hypertension   . Diabetes mellitus without complication   . CHF (congestive heart failure)   . Hypercholesteremia   . 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 Kidney transplant Right 05/14/2010  . Av fistula placement Left July 2011  . Nephrectomy transplanted organ    . Hernia repair    . Rotator cuff repair     Family History  Problem Relation Age of Onset  . Cancer Mother     unsure of type   History  Substance Use Topics  . Smoking status: Former Smoker -- 1.50 packs/day for 40 years    Types: Cigarettes    Quit date: 09/02/1992  . Smokeless tobacco: Never Used  . Alcohol Use: No    Review of Systems Limited review of systems unobtainable secondary to the patient's respiratory distress. Patient denies the following-fever, chest pain, abdominal pain, vomiting, diarrhea, genitourinary symptoms.  Allergies  Review of patient's allergies  indicates no known allergies.  Home Medications   Current Outpatient Rx  Name  Route  Sig  Dispense  Refill  . allopurinol (ZYLOPRIM) 100 MG tablet   Oral   Take 100 mg by mouth 3 (three) times daily.         Marland Kitchen aspirin EC 81 MG tablet   Oral   Take 81 mg by mouth daily.         . Calcium Carbonate-Vitamin D (CALTRATE 600+D PO)   Oral   Take 1 tablet by mouth 2 (two) times daily.         . cyanocobalamin 1000 MCG tablet   Oral   Take 1,000 mcg by mouth daily.         . finasteride (PROSCAR) 5 MG tablet   Oral   Take 5 mg by mouth daily.         Marland Kitchen gabapentin (NEURONTIN) 300 MG capsule   Oral   Take 300 mg by mouth daily.         Marland Kitchen HYDROcodone-acetaminophen (NORCO/VICODIN) 5-325 MG per tablet   Oral   Take 1 tablet by mouth 3 (three) times daily as needed for pain. For pain         . insulin NPH (HUMULIN N,NOVOLIN N) 100 UNIT/ML injection   Subcutaneous   Inject 28 Units into the skin at bedtime.         Marland Kitchen  lisinopril (PRINIVIL,ZESTRIL) 10 MG tablet   Oral   Take 5 mg by mouth 2 (two) times daily.          . mycophenolate (CELLCEPT) 250 MG capsule   Oral   Take 500 mg by mouth 2 (two) times daily.          Marland Kitchen omeprazole (PRILOSEC) 20 MG capsule   Oral   Take 20 mg by mouth daily.         . predniSONE (DELTASONE) 5 MG tablet   Oral   Take 5 mg by mouth daily.         . simvastatin (ZOCOR) 40 MG tablet   Oral   Take 20 mg by mouth every evening.         . tacrolimus (PROGRAF) 1 MG capsule   Oral   Take 3 mg by mouth 2 (two) times daily.          Marland Kitchen terazosin (HYTRIN) 10 MG capsule   Oral   Take 10 mg by mouth daily.           Pulse 124  Resp 38  SpO2 100% Physical Exam Gen: well developed and well nourished appearing, appears in acute distress Head: NCAT Eyes: PERL, EOMI Nose: no epistaixis or rhinorrhea Mouth/throat: mucosa is moist and pink Neck: trace jvd resp: 40s, course bibasilar rales into the mid lung fields,  accessory muscle use and retractions CV: rapid and regular, no murmur, extremities appear well perfused Abd: soft, notender, nondistended Back: no ttp, no cva ttp, normal to inspection Skin: warm and dry Ext: trace pretibial  edema, normal to inspection Neuro: CN ii-xii grossly intact, no focal deficits Psyche; anxious affect,  calm and cooperative.   ED Course  Procedures (including critical care time) Results for orders placed during the hospital encounter of 08/26/13 (from the past 24 hour(s))  BASIC METABOLIC PANEL     Status: Abnormal   Collection Time    08/26/13  4:52 AM      Result Value Range   Sodium 139  135 - 145 mEq/L   Potassium 4.4  3.5 - 5.1 mEq/L   Chloride 102  96 - 112 mEq/L   CO2 24  19 - 32 mEq/L   Glucose, Bld 91  70 - 99 mg/dL   BUN 33 (*) 6 - 23 mg/dL   Creatinine, Ser 9.60 (*) 0.50 - 1.35 mg/dL   Calcium 8.4  8.4 - 45.4 mg/dL   GFR calc non Af Amer 39 (*) >90 mL/min   GFR calc Af Amer 45 (*) >90 mL/min  CBC WITH DIFFERENTIAL     Status: Abnormal   Collection Time    08/26/13  4:52 AM      Result Value Range   WBC 5.1  4.0 - 10.5 K/uL   RBC 3.93 (*) 4.22 - 5.81 MIL/uL   Hemoglobin 11.9 (*) 13.0 - 17.0 g/dL   HCT 09.8 (*) 11.9 - 14.7 %   MCV 93.1  78.0 - 100.0 fL   MCH 30.3  26.0 - 34.0 pg   MCHC 32.5  30.0 - 36.0 g/dL   RDW 82.9  56.2 - 13.0 %   Platelets 122 (*) 150 - 400 K/uL   Neutrophils Relative % 81 (*) 43 - 77 %   Neutro Abs 4.1  1.7 - 7.7 K/uL   Lymphocytes Relative 13  12 - 46 %   Lymphs Abs 0.7  0.7 - 4.0 K/uL   Monocytes Relative 5  3 - 12 %   Monocytes Absolute 0.2  0.1 - 1.0 K/uL   Eosinophils Relative 1  0 - 5 %   Eosinophils Absolute 0.0  0.0 - 0.7 K/uL   Basophils Relative 0  0 - 1 %   Basophils Absolute 0.0  0.0 - 0.1 K/uL  POCT I-STAT TROPONIN I     Status: None   Collection Time    08/26/13  4:55 AM      Result Value Range   Troponin i, poc 0.08  0.00 - 0.08 ng/mL   Comment 3           POCT I-STAT 3, BLOOD GAS (G3P V)      Status: Abnormal   Collection Time    08/26/13  5:02 AM      Result Value Range   pH, Ven 7.383 (*) 7.250 - 7.300   pCO2, Ven 45.0  45.0 - 50.0 mmHg   pO2, Ven 47.0 (*) 30.0 - 45.0 mmHg   Bicarbonate 26.8 (*) 20.0 - 24.0 mEq/L   TCO2 28  0 - 100 mmol/L   O2 Saturation 82.0     Acid-Base Excess 1.0  0.0 - 2.0 mmol/L   Sample type VENOUS     EKG: sinus tach, normal axis, ST depression in the anterolateral leads which is new compared to most recent EKG comparison, no ST elevation, normal intervals.   CXR: pulmonary edema  MDM  Patient presents with acute respiratory failure and ST depression in anterolateral leads on EKG. First troponin is wnl. The patient has known CAD and EKG changes are likely secondary to demand ischemia. We are treating with ASA and lasix for diuresis. The patient's BP is wnl. He has a 1" nitroglycerin paste on which was administered by EMS. We are continuing on BiPAP and will monitor closely for response to lasix with plan to repeat EKG and trend troponins. Hospitalist paged to request admission to the SDU.   CRITICAL CARE Performed by: Brandt Loosen   Total critical care time: 25m  Critical care time was exclusive of separately billable procedures and treating other patients.  Critical care was necessary to treat or prevent imminent or life-threatening deterioration.  Critical care was time spent personally by me on the following activities: development of treatment plan with patient and/or surrogate as well as nursing, discussions with consultants, evaluation of patient's response to treatment, examination of patient, obtaining history from patient or surrogate, ordering and performing treatments and interventions, ordering and review of laboratory studies, ordering and review of radiographic studies, pulse oximetry and re-evaluation of patient's condition.     Brandt Loosen, MD 08/26/13 [phone]

## 2013-08-26 NOTE — ED Notes (Addendum)
Pt presents via [facility] in respiratory distress.  Pt woke up this am in respiratory distress with O2 saturation of 80%, placed on CPAP via EMS, O2 improved to 98%.  Pt presented with R30, BP 180/100 and Pulse of 126.  Pt has 1" Nitro past on Right Upper Chest.  Pt in ED is tachy with a pulse of 126 and R 25 placed on BiPAP with respiratory at bedside.

## 2013-08-26 NOTE — H&P (Signed)
[facility] History and Physical  Jon Burns WUJ:[id number] DOB: 10-20-1940    PCP:   Marin Comment, FNP   Chief Complaint: acute shortness of breath.  HPI: Jon Burns is an 72 y.o. male with hx of DM, ESRD s/p renal transplant (never was on dialysis), hx of CHF, HTN, hyperlipidemia, presents to the ER in respiratory extremis.  Wife stated he has non productive coughs for 4 days, but last night became acutely short of breath.  He had no chest pain, fever, chills, leg pain or swelling.  In the ER, he was placed on Bipap and felt markedly better.  Evalaution in the ER included a CXR which showed vascular congestion consistent with pulmonary edema (cannot exclude PNA), his EKG showed ST with some ST depression on the lateral leads.  His BP was soft, but he was mentating well, and wife said his BP is usually low.  He has no leukocytosis, normal Hb, and Cr of 1.7.  He was given IV lasix of 80mg , and diused well and felt better.  Hospitalist was asked to admit him for "flash" pulmonary edema.  Rewiew of Systems:  Constitutional: Negative for malaise, fever and chills. No significant weight loss or weight gain Eyes: Negative for eye pain, redness and discharge, diplopia, visual changes, or flashes of light. ENMT: Negative for ear pain, hoarseness, nasal congestion, sinus pressure and sore throat. No headaches; tinnitus, drooling, or problem swallowing. Cardiovascular: Negative for chest pain, palpitations, diaphoresis, . ; No orthopnea, PND Respiratory: Negative for cough, hemoptysis, and stridor. No pleuritic chestpain. Gastrointestinal: Negative for nausea, vomiting, diarrhea, constipation, abdominal pain, melena, blood in stool, hematemesis, jaundice and rectal bleeding.    Genitourinary: Negative for frequency, dysuria, incontinence,flank pain and hematuria; Musculoskeletal: Negative for back pain and neck pain. Negative for swelling and trauma.;  Skin: . Negative for pruritus,  rash, abrasions, bruising and skin lesion.; ulcerations Neuro: Negative for headache, lightheadedness and neck stiffness. Negative for weakness, altered level of consciousness , altered mental status, extremity weakness, burning feet, involuntary movement, seizure and syncope.  Psych: negative for anxiety, depression, insomnia, tearfulness, panic attacks, hallucinations, paranoia, suicidal or homicidal ideation    Past Medical History  Diagnosis Date  . Hypertension   . Diabetes mellitus without complication   . CHF (congestive heart failure)   . Hypercholesteremia   . 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 Kidney transplant Right 05/14/2010  . Av fistula placement Left July 2011  . Nephrectomy transplanted organ    . Hernia repair    . Rotator cuff repair      Medications:  HOME MEDS: Prior to Admission medications   Medication Sig Start Date End Date Taking? Authorizing Provider  allopurinol (ZYLOPRIM) 100 MG tablet Take 100 mg by mouth 3 (three) times daily.    Historical Provider, MD  aspirin EC 81 MG tablet Take 81 mg by mouth daily.    Historical Provider, MD  Calcium Carbonate-Vitamin D (CALTRATE 600+D PO) Take 1 tablet by mouth 2 (two) times daily.    Historical Provider, MD  cyanocobalamin 1000 MCG tablet Take 1,000 mcg by mouth daily.    Historical Provider, MD  finasteride (PROSCAR) 5 MG tablet Take 5 mg by mouth daily.    Historical Provider, MD  gabapentin (NEURONTIN) 300 MG capsule Take 300 mg by mouth daily.    Historical Provider, MD  HYDROcodone-acetaminophen (NORCO/VICODIN) 5-325 MG per tablet Take 1 tablet  by mouth 3 (three) times daily as needed for pain. For pain    Historical Provider, MD  insulin NPH (HUMULIN N,NOVOLIN N) 100 UNIT/ML injection Inject 28 Units into the skin at bedtime.    Historical Provider, MD  lisinopril (PRINIVIL,ZESTRIL) 10 MG tablet Take 5 mg by mouth 2 (two) times daily.      Historical Provider, MD  mycophenolate (CELLCEPT) 250 MG capsule Take 500 mg by mouth 2 (two) times daily.     Historical Provider, MD  omeprazole (PRILOSEC) 20 MG capsule Take 20 mg by mouth daily.    Historical Provider, MD  predniSONE (DELTASONE) 5 MG tablet Take 5 mg by mouth daily.    Historical Provider, MD  simvastatin (ZOCOR) 40 MG tablet Take 20 mg by mouth every evening.    Historical Provider, MD  tacrolimus (PROGRAF) 1 MG capsule Take 3 mg by mouth 2 (two) times daily.     Historical Provider, MD  terazosin (HYTRIN) 10 MG capsule Take 10 mg by mouth daily.     Historical Provider, MD     Allergies:  No Known Allergies  Social History:   reports that he quit smoking about 20 years ago. His smoking use included Cigarettes. He has a 60 pack-year smoking history. He has never used smokeless tobacco. He reports that he does not drink alcohol or use illicit drugs.  Family History: Family History  Problem Relation Age of Onset  . Cancer Mother     unsure of type     Physical Exam: Filed Vitals:   08/26/13 0515 08/26/13 0530 08/26/13 0545 08/26/13 0610  BP: 119/77 116/74 104/69 102/70  Pulse: 122 118 116 116  Resp: 34 26 27 17   SpO2: 95% 96% 97% 98%   Blood pressure 102/70, pulse 116, resp. rate 17, SpO2 98.00%.  GEN:  Pleasant  patient lying in the stretcher in no acute distress; cooperative with exam. PSYCH:  alert and oriented x4; does not appear anxious or depressed; affect is appropriate. HEENT: Mucous membranes pink and anicteric; PERRLA; EOM intact; no cervical lymphadenopathy nor thyromegaly or carotid bruit; no JVD; There were no stridor. Neck is very supple. Breasts:: Not examined CHEST WALL: No tenderness CHEST: Normal respiration, no wheezing, but bilateral rales. HEART: Regular rate and rhythm.  There are no murmur, rub, or gallops.   BACK: No kyphosis or scoliosis; no CVA tenderness ABDOMEN: soft and non-tender; no masses, no organomegaly, normal abdominal  bowel sounds; no pannus; no intertriginous candida. There is no rebound and no distention. Rectal Exam: Not done EXTREMITIES: No bone or joint deformity; age-appropriate arthropathy of the hands and knees; 2+edema; no ulcerations.  There is no calf tenderness. Genitalia: not examined PULSES: 2+ and symmetric SKIN: Normal hydration no rash or ulceration CNS: Cranial nerves 2-12 grossly intact no focal lateralizing neurologic deficit.  Speech is fluent; uvula elevated with phonation, facial symmetry and tongue midline. DTR are normal bilaterally, cerebella exam is intact, barbinski is negative and strengths are equaled bilaterally.  No sensory loss.   Labs on Admission:  Basic Metabolic Panel:  Recent Labs Lab 08/26/13 0452  NA 139  K 4.4  CL 102  CO2 24  GLUCOSE 91  BUN 33*  CREATININE 1.72*  CALCIUM 8.4   Liver Function Tests: No results found for this basename: AST, ALT, ALKPHOS, BILITOT, PROT, ALBUMIN,  in the last 168 hours No results found for this basename: LIPASE, AMYLASE,  in the last 168 hours No results found for this basename: AMMONIA,  in the last 168 hours CBC:  Recent Labs Lab 08/26/13 0452  WBC 5.1  NEUTROABS 4.1  HGB 11.9*  HCT 36.6*  MCV 93.1  PLT 122*   Cardiac Enzymes: No results found for this basename: CKTOTAL, CKMB, CKMBINDEX, TROPONINI,  in the last 168 hours  CBG:  Recent Labs Lab 08/26/13 0608  GLUCAP 132*     Radiological Exams on Admission: Dg Chest Portable 1 View  08/26/2013   CLINICAL DATA:  Respiratory distress.  EXAM: PORTABLE CHEST - 1 VIEW  COMPARISON:  Chest radiograph performed 06/17/2013  FINDINGS: The lungs are well-aerated. Vascular congestion is noted, with bilateral central airspace opacities, compatible with pulmonary edema. Superimposed pneumonia cannot be excluded. There is no evidence of pleural effusion or pneumothorax. A 1.3 cm nodule is again noted in the left midlung zone.  The cardiomediastinal silhouette is within  normal limits. No acute osseous abnormalities are seen.  IMPRESSION: 1. Vascular congestion, with bilateral central airspace opacities, compatible with pulmonary edema. Superimposed pneumonia cannot be excluded. 2. 1.3 cm nodule again noted at the left midlung zone. This has been present since April, and contrast enhanced CT of the chest was recommended for further evaluation. Would correlate as to whether a CT of the chest was performed, and if not, would schedule the CT of the chest on an elective non-emergent basis.   Electronically Signed   By: Roanna Raider M.D.   On: 08/26/2013 05:48    EKG: Independently reviewed. Sinus Tach, with some ST segment depressions over the precordial leads.   Assessment/Plan Present on Admission:  . Pulmonary edema cardiac cause . Respiratory failure . BPH (benign prostatic hyperplasia) . Diabetes mellitus . HTN (hypertension) . CHF (congestive heart failure) . Bruit . Acute pulmonary edema  PLAN:  I suspect he has acute cardiogenic pulmonary edema.  He also may have some diastolic dysfunction as well as volume overload.  He is doing better now on Bipap.  Will admit him to the SDU, start titrating low dose NTG IV, along with continuing IV diauresis.  I have continued his meds.  For his DM, will continue insulin and place on carb modified diet.  I don't think he has PNA, so no antibiotic was given.  For his renal transplant, he had received both his flu and pneumovax, and we will continue his immunosupressive drug including steroid.   His BP is borderline, but it is usual for him.  He is stable, full code, and will be admitted to [facility] service.  At your convenience, please consult Dr Charlton Haws who is his regular cardiologist.  Thank you for allowing me to participate in his care.  Other plans as per orders.  Code Status: FULL Unk Lightning, MD. [facility] Pager [phone] 7pm to 7am.  08/26/2013, 6:24 AM

## 2013-08-27 ENCOUNTER — Inpatient Hospital Stay (HOSPITAL_COMMUNITY): Payer: Medicare Other

## 2013-08-27 ENCOUNTER — Encounter (HOSPITAL_COMMUNITY): Payer: Self-pay | Admitting: General Practice

## 2013-08-27 DIAGNOSIS — I5031 Acute diastolic (congestive) heart failure: Secondary | ICD-10-CM

## 2013-08-27 DIAGNOSIS — I519 Heart disease, unspecified: Secondary | ICD-10-CM

## 2013-08-27 LAB — CBC WITH DIFFERENTIAL/PLATELET
Basophils Absolute: 0 10*3/uL (ref 0.0–0.1)
Eosinophils Absolute: 0 10*3/uL (ref 0.0–0.7)
Eosinophils Relative: 0 % (ref 0–5)
HCT: 34.5 % — ABNORMAL LOW (ref 39.0–52.0)
Hemoglobin: 11 g/dL — ABNORMAL LOW (ref 13.0–17.0)
Lymphocytes Relative: 9 % — ABNORMAL LOW (ref 12–46)
Lymphs Abs: 0.5 10*3/uL — ABNORMAL LOW (ref 0.7–4.0)
MCHC: 31.9 g/dL (ref 30.0–36.0)
MCV: 94 fL (ref 78.0–100.0)
Monocytes Absolute: 0.6 10*3/uL (ref 0.1–1.0)
Monocytes Relative: 10 % (ref 3–12)
Neutrophils Relative %: 81 % — ABNORMAL HIGH (ref 43–77)
RBC: 3.67 MIL/uL — ABNORMAL LOW (ref 4.22–5.81)
RDW: 15.5 % (ref 11.5–15.5)

## 2013-08-27 LAB — BASIC METABOLIC PANEL
BUN: 35 mg/dL — ABNORMAL HIGH (ref 6–23)
CO2: 29 mEq/L (ref 19–32)
Chloride: 98 mEq/L (ref 96–112)
Creatinine, Ser: 1.55 mg/dL — ABNORMAL HIGH (ref 0.50–1.35)
Sodium: 137 mEq/L (ref 135–145)

## 2013-08-27 LAB — GLUCOSE, CAPILLARY
Glucose-Capillary: 170 mg/dL — ABNORMAL HIGH (ref 70–99)
Glucose-Capillary: 283 mg/dL — ABNORMAL HIGH (ref 70–99)

## 2013-08-27 MED ORDER — HYDROCODONE-ACETAMINOPHEN 10-325 MG PO TABS: 1.0000 | ORAL_TABLET | Freq: Four times a day (QID) | ORAL | Status: DC | PRN

## 2013-08-27 MED ORDER — GUAIFENESIN ER 600 MG PO TB12
1200.0000 mg | ORAL_TABLET | Freq: Two times a day (BID) | ORAL | Status: DC
  Administered 2013-08-27 – 2013-08-29 (×5): 1200 mg via ORAL
  Filled 2013-08-27 (×6): qty 2

## 2013-08-27 MED ORDER — FUROSEMIDE 10 MG/ML IJ SOLN
40.0000 mg | Freq: Once | INTRAMUSCULAR | Status: AC
  Administered 2013-08-27: 40 mg via INTRAVENOUS

## 2013-08-27 MED ORDER — INSULIN NPH (HUMAN) (ISOPHANE) 100 UNIT/ML ~~LOC~~ SUSP: 20.0000 [IU] | Freq: Every day | SUBCUTANEOUS | Status: DC

## 2013-08-27 MED ORDER — ALBUTEROL SULFATE (5 MG/ML) 0.5% IN NEBU
2.5000 mg | INHALATION_SOLUTION | Freq: Once | RESPIRATORY_TRACT | Status: AC
  Administered 2013-08-27: 2.5 mg via RESPIRATORY_TRACT
  Filled 2013-08-27: qty 0.5

## 2013-08-27 MED ORDER — FUROSEMIDE 10 MG/ML IJ SOLN
40.0000 mg | Freq: Two times a day (BID) | INTRAMUSCULAR | Status: DC
  Administered 2013-08-27 – 2013-08-28 (×2): 40 mg via INTRAVENOUS
  Filled 2013-08-27 (×4): qty 4

## 2013-08-27 MED ORDER — INSULIN NPH (HUMAN) (ISOPHANE) 100 UNIT/ML ~~LOC~~ SUSP
20.0000 [IU] | Freq: Every day | SUBCUTANEOUS | Status: DC
  Administered 2013-08-29: 20 [IU] via SUBCUTANEOUS

## 2013-08-27 MED ORDER — ALLOPURINOL 100 MG PO TABS
100.0000 mg | ORAL_TABLET | Freq: Every morning | ORAL | Status: DC
  Administered 2013-08-28 – 2013-08-29 (×2): 100 mg via ORAL
  Filled 2013-08-27 (×2): qty 1

## 2013-08-27 NOTE — Progress Notes (Signed)
Utilization review completed. Ronda Rajkumar, RN, BSN. 

## 2013-08-27 NOTE — Progress Notes (Signed)
[facility] PROGRESS NOTE Interim History: 72 y.o. male with hx of DM, ESRD s/p renal transplant (never was on dialysis), hx of CHF, HTN, hyperlipidemia, presents to the ER in respiratory extremis. Wife stated he has non productive coughs for 4 days, but last night became acutely short of breath. He had no chest pain, fever, chills, leg pain or swelling. In the ER, he was placed on Bipap and felt markedly better. Evalaution in the ER included a CXR which showed vascular congestion consistent with pulmonary edema (cannot exclude PNA), his EKG showed ST with some ST depression on the lateral leads. His BP was soft, but he was mentating well, and wife said his BP is usually low. He has no leukocytosis, normal Hb, and Cr of 1.7.     Assessment/Plan: Acute diastolic heart failure/ Acute pulmonary edema - I agree with IV lasix, moniotr electrolytes, + JVD lungs are clear. - He did have a mild bump Ti, which are now trending down. check a 2-d echo. - stricit I and O's, transfer to telemetry.  Diabetes mellitus: - resume home medications. - CBG's ACHS  History of renal transplantation - basleine Cr 1.3-1.5  HTN (hypertension): - stable cont medications, lasix and lisinopril.   Code Status: full Family Communication: none  Disposition Plan: inpatient   Consultants:  none  Procedures:  Echo  CXR  Antibiotics:  None  HPI/Subjective: Pt relates his breathing is much improved. "he has been being like a horse" Hungry wants a diet.  Objective: Filed Vitals:   08/27/13 0450 08/27/13 0600 08/27/13 0700 08/27/13 0735  BP:  109/58 118/66   Pulse:  82 84   Temp:    97.9 F (36.6 C)  TempSrc:    Oral  Resp:  25 15   SpO2: 100% 97% 100%     Intake/Output Summary (Last 24 hours) at 08/27/13 0803 Last data filed at 08/27/13 0700  Gross per 24 hour  Intake    923 ml  Output   3900 ml  Net  -2977 ml   There were no vitals filed for this visit.  Exam:  General:  Alert, awake, oriented x3, in no acute distress.  HEENT: No bruits, no goiter. + JVD Heart: Regular rate and rhythm, without murmurs, rubs, gallops.  Lungs: Good air movement, clear to auscultation. Abdomen: Soft, nontender, nondistended, positive bowel sounds.    Data Reviewed: Basic Metabolic Panel:  Recent Labs Lab 08/26/13 0452 08/26/13 1050  NA 139  --   K 4.4  --   CL 102  --   CO2 24  --   GLUCOSE 91  --   BUN 33*  --   CREATININE 1.72* 1.54*  CALCIUM 8.4  --    Liver Function Tests: No results found for this basename: AST, ALT, ALKPHOS, BILITOT, PROT, ALBUMIN,  in the last 168 hours No results found for this basename: LIPASE, AMYLASE,  in the last 168 hours No results found for this basename: AMMONIA,  in the last 168 hours CBC:  Recent Labs Lab 08/26/13 0452 08/26/13 1050  WBC 5.1 8.3  NEUTROABS 4.1  --   HGB 11.9* 10.8*  HCT 36.6* 33.0*  MCV 93.1 93.0  PLT 122* 112*   Cardiac Enzymes:  Recent Labs Lab 08/26/13 0920 08/26/13 1420 08/26/13 2005  TROPONINI 0.95* 1.57* 1.17*   BNP (last 3 results)  Recent Labs  05/16/13 0508 05/17/13 0425 08/26/13 0452  PROBNP 5098.0* 7029.0* 3270.0*   CBG:  Recent Labs Lab 08/26/13  4098 08/26/13 0837 08/26/13 1153 08/26/13 1647 08/26/13 2104  GLUCAP 132* 124* 117* 144* 185*    Recent Results (from the past 240 hour(s))  MRSA PCR SCREENING     Status: None   Collection Time    08/26/13  8:54 AM      Result Value Range Status   MRSA by PCR NEGATIVE  NEGATIVE Final   Comment:            The GeneXpert MRSA Assay (FDA     approved for NASAL specimens     only), is one component of a     comprehensive MRSA colonization     surveillance program. It is not     intended to diagnose MRSA     infection nor to guide or     monitor treatment for     MRSA infections.     Studies: Dg Chest Port 1 View  08/27/2013   CLINICAL DATA:  Chest tightness, cough and congestion.  EXAM: PORTABLE CHEST - 1 VIEW   COMPARISON:  Chest radiograph performed 08/26/2013  FINDINGS: The lungs are well-aerated. Vascular congestion is noted, with mildly increased interstitial markings, raising question for minimal interstitial edema. There is no evidence of pleural effusion or pneumothorax.  The cardiomediastinal silhouette is borderline normal in size. No acute osseous abnormalities are seen.  IMPRESSION: Vascular congestion, with mildly increased interstitial markings, raising question for minimal interstitial edema.   Electronically Signed   By: Roanna Raider M.D.   On: 08/27/2013 04:38   Dg Chest Portable 1 View  08/26/2013   CLINICAL DATA:  Respiratory distress.  EXAM: PORTABLE CHEST - 1 VIEW  COMPARISON:  Chest radiograph performed 06/17/2013  FINDINGS: The lungs are well-aerated. Vascular congestion is noted, with bilateral central airspace opacities, compatible with pulmonary edema. Superimposed pneumonia cannot be excluded. There is no evidence of pleural effusion or pneumothorax. A 1.3 cm nodule is again noted in the left midlung zone.  The cardiomediastinal silhouette is within normal limits. No acute osseous abnormalities are seen.  IMPRESSION: 1. Vascular congestion, with bilateral central airspace opacities, compatible with pulmonary edema. Superimposed pneumonia cannot be excluded. 2. 1.3 cm nodule again noted at the left midlung zone. This has been present since April, and contrast enhanced CT of the chest was recommended for further evaluation. Would correlate as to whether a CT of the chest was performed, and if not, would schedule the CT of the chest on an elective non-emergent basis.   Electronically Signed   By: Roanna Raider M.D.   On: 08/26/2013 05:48    Scheduled Meds: . allopurinol  100 mg Oral TID  . aspirin EC  81 mg Oral Daily  . finasteride  5 mg Oral Daily  . gabapentin  300 mg Oral Daily  . guaiFENesin  1,200 mg Oral BID  . heparin  5,000 Units Subcutaneous Q8H  . insulin aspart  0-9 Units  Subcutaneous TID WC  . insulin NPH  28 Units Subcutaneous QHS  . lisinopril  5 mg Oral BID  . mycophenolate  500 mg Oral BID  . pantoprazole  40 mg Oral Daily  . predniSONE  5 mg Oral Daily  . simvastatin  20 mg Oral QPM  . sodium chloride  3 mL Intravenous Q12H  . sodium chloride  3 mL Intravenous Q12H  . tacrolimus  3 mg Oral BID  . terazosin  10 mg Oral Daily  . cyanocobalamin  1,000 mcg Oral Daily   Continuous Infusions: .  nitroGLYCERIN 5 mcg/min (08/26/13 0843)     Marinda Elk  [facility] Pager [phone]. If 8PM-8AM, please contact night-coverage at [email or web address], password [facility] 08/27/2013, 8:03 AM  LOS: 1 day

## 2013-08-27 NOTE — Progress Notes (Addendum)
Patient C/O worsening cough, "My chest feels tight".  Patient awake, alert, oriented X 3.  Mucous membranes appear dry.  Anterior and cervical lymphadenopathy present, slight JVD at 30 degrees.  Bilateral breath sounds, decreased in bases bilaterally, R>L with few bibasilar crackles with coarse rhonchi in upper airways which do not clear with cough.  Nonproductive cough.  Slight tachypnea with RR 24-28.  O2 Sats 100% on 4L/M [place].  Afebrile at moment.  I & O:  In: 711 Out:  2900 Balance -2637.  Dr. Ardeth Sportsman notified of all parameters.  Orders rec'd.  Emotional support to patient.

## 2013-08-27 NOTE — Progress Notes (Signed)
  Echocardiogram 2D Echocardiogram has been performed.  Jon Burns 08/27/2013, 12:13 PM

## 2013-08-28 DIAGNOSIS — I10 Essential (primary) hypertension: Secondary | ICD-10-CM

## 2013-08-28 LAB — BASIC METABOLIC PANEL
BUN: 38 mg/dL — ABNORMAL HIGH (ref 6–23)
CO2: 30 mEq/L (ref 19–32)
Chloride: 97 mEq/L (ref 96–112)
GFR calc Af Amer: 51 mL/min — ABNORMAL LOW (ref >90)
Potassium: 3.9 mEq/L (ref 3.5–5.1)

## 2013-08-28 LAB — GLUCOSE, CAPILLARY
Glucose-Capillary: 92 mg/dL (ref 70–99)
Glucose-Capillary: 129 mg/dL — ABNORMAL HIGH (ref 70–99)
Glucose-Capillary: 204 mg/dL — ABNORMAL HIGH (ref 70–99)

## 2013-08-28 LAB — CBC
HCT: 35.1 % — ABNORMAL LOW (ref 39.0–52.0)
Hemoglobin: 11.1 g/dL — ABNORMAL LOW (ref 13.0–17.0)
MCV: 92.6 fL (ref 78.0–100.0)
RBC: 3.79 MIL/uL — ABNORMAL LOW (ref 4.22–5.81)
WBC: 4.4 10*3/uL (ref 4.0–10.5)

## 2013-08-28 MED ORDER — FUROSEMIDE 20 MG PO TABS
20.0000 mg | ORAL_TABLET | Freq: Every day | ORAL | Status: DC
  Administered 2013-08-29: 20 mg via ORAL
  Filled 2013-08-28: qty 1

## 2013-08-28 MED ORDER — FUROSEMIDE 40 MG PO TABS: 40.0000 mg | ORAL_TABLET | Freq: Two times a day (BID) | ORAL | Status: DC

## 2013-08-28 MED ORDER — FUROSEMIDE 20 MG PO TABS
20.0000 mg | ORAL_TABLET | Freq: Every day | ORAL | Status: DC
  Filled 2013-08-28: qty 1

## 2013-08-28 MED ORDER — FUROSEMIDE 40 MG PO TABS
40.0000 mg | ORAL_TABLET | Freq: Two times a day (BID) | ORAL | Status: DC
  Filled 2013-08-28: qty 1

## 2013-08-28 NOTE — Progress Notes (Addendum)
[facility] PROGRESS NOTE Interim History: 72 y.o. male with hx of DM, ESRD s/p renal transplant (never was on dialysis), hx of CHF, HTN, hyperlipidemia, presents to the ER in respiratory extremis. Wife stated he has non productive coughs for 4 days, but last night became acutely short of breath. He had no chest pain, fever, chills, leg pain or swelling. In the ER, he was placed on Bipap and felt markedly better. Evalaution in the ER included a CXR which showed vascular congestion consistent with pulmonary edema (cannot exclude PNA), his EKG showed ST with some ST depression on the lateral leads. His BP was soft, but he was mentating well, and wife said his BP is usually low. He has no leukocytosis, normal Hb, and Cr of 1.7.   Assessment/Plan: Acute diastolic heart failure/ Acute pulmonary edema - - JVD lungs are clear. - He did have a mild bump Ti, which trended down.  - 2-d echo showed no wall motion abnormality. - stricit I and O's, d/c telemetry. - change lasix to orals and if remains stable can be d/c in am.  Diabetes mellitus: - resume home medications. - CBG's ACHS  History of renal transplantation - basleine Cr 1.3-1.5  HTN (hypertension): - stable cont medications, lasix and lisinopril.   Code Status: full Family Communication: none  Disposition Plan: inpatient   Consultants:  none  Procedures:  Echo  CXR  Antibiotics:  None  HPI/Subjective:  Hungry wants a diet.  Objective: Filed Vitals:   08/27/13 1500 08/27/13 2102 08/28/13 0500 08/28/13 0958  BP:  155/74 158/76 130/72  Pulse:  79 79   Temp:  98.5 F (36.9 C) 98.5 F (36.9 C)   TempSrc:  Oral Oral   Resp:  18 18   Height: 6\' 1"  (1.854 m)     Weight: 95.255 kg (210 lb)  95.5 kg (210 lb 8.6 oz)   SpO2:  97% 96%     Intake/Output Summary (Last 24 hours) at 08/28/13 1249 Last data filed at 08/28/13 0900  Gross per 24 hour  Intake 1257.5 ml  Output   2450 ml  Net -1192.5 ml   Filed  Weights   08/27/13 1500 08/28/13 0500  Weight: 95.255 kg (210 lb) 95.5 kg (210 lb 8.6 oz)    Exam:  General: Alert, awake, oriented x3, in no acute distress.  HEENT: No bruits, no goiter. - JVD Heart: Regular rate and rhythm, without murmurs, rubs, gallops.  Lungs: Good air movement, clear to auscultation. Abdomen: Soft, nontender, nondistended, positive bowel sounds.    Data Reviewed: Basic Metabolic Panel:  Recent Labs Lab 08/26/13 0452 08/26/13 1050 08/27/13 0910 08/28/13 0500  NA 139  --  137 137  K 4.4  --  4.2 3.9  CL 102  --  98 97  CO2 24  --  29 30  GLUCOSE 91  --  142* 87  BUN 33*  --  35* 38*  CREATININE 1.72* 1.54* 1.55* 1.54*  CALCIUM 8.4  --  8.5 8.8   Liver Function Tests: No results found for this basename: AST, ALT, ALKPHOS, BILITOT, PROT, ALBUMIN,  in the last 168 hours No results found for this basename: LIPASE, AMYLASE,  in the last 168 hours No results found for this basename: AMMONIA,  in the last 168 hours CBC:  Recent Labs Lab 08/26/13 0452 08/26/13 1050 08/27/13 0910 08/28/13 0500  WBC 5.1 8.3 5.9 4.4  NEUTROABS 4.1  --  4.8  --   HGB 11.9* 10.8*  11.0* 11.1*  HCT 36.6* 33.0* 34.5* 35.1*  MCV 93.1 93.0 94.0 92.6  PLT 122* 112* 101* 110*   Cardiac Enzymes:  Recent Labs Lab 08/26/13 0920 08/26/13 1420 08/26/13 2005  TROPONINI 0.95* 1.57* 1.17*   BNP (last 3 results)  Recent Labs  05/16/13 0508 05/17/13 0425 08/26/13 0452  PROBNP 5098.0* 7029.0* 3270.0*   CBG:  Recent Labs Lab 08/27/13 1206 08/27/13 1604 08/27/13 2054 08/28/13 0607 08/28/13 1100  GLUCAP 170* 283* 253* 92 129*    Recent Results (from the past 240 hour(s))  MRSA PCR SCREENING     Status: None   Collection Time    08/26/13  8:54 AM      Result Value Range Status   MRSA by PCR NEGATIVE  NEGATIVE Final   Comment:            The GeneXpert MRSA Assay (FDA     approved for NASAL specimens     only), is one component of a     comprehensive MRSA  colonization     surveillance program. It is not     intended to diagnose MRSA     infection nor to guide or     monitor treatment for     MRSA infections.     Studies: Dg Chest Port 1 View  08/27/2013   CLINICAL DATA:  Chest tightness, cough and congestion.  EXAM: PORTABLE CHEST - 1 VIEW  COMPARISON:  Chest radiograph performed 08/26/2013  FINDINGS: The lungs are well-aerated. Vascular congestion is noted, with mildly increased interstitial markings, raising question for minimal interstitial edema. There is no evidence of pleural effusion or pneumothorax.  The cardiomediastinal silhouette is borderline normal in size. No acute osseous abnormalities are seen.  IMPRESSION: Vascular congestion, with mildly increased interstitial markings, raising question for minimal interstitial edema.   Electronically Signed   By: Roanna Raider M.D.   On: 08/27/2013 04:38    Scheduled Meds: . allopurinol  100 mg Oral q morning - 10a  . aspirin EC  81 mg Oral Daily  . finasteride  5 mg Oral Daily  . furosemide  40 mg Intravenous BID  . gabapentin  300 mg Oral Daily  . guaiFENesin  1,200 mg Oral BID  . heparin  5,000 Units Subcutaneous Q8H  . insulin aspart  0-9 Units Subcutaneous TID WC  . insulin NPH  20 Units Subcutaneous QAC breakfast  . insulin NPH  28 Units Subcutaneous QHS  . lisinopril  5 mg Oral BID  . mycophenolate  500 mg Oral BID  . pantoprazole  40 mg Oral Daily  . predniSONE  5 mg Oral Daily  . simvastatin  20 mg Oral QPM  . sodium chloride  3 mL Intravenous Q12H  . sodium chloride  3 mL Intravenous Q12H  . tacrolimus  3 mg Oral BID  . terazosin  10 mg Oral Daily  . cyanocobalamin  1,000 mcg Oral Daily   Continuous Infusions: . nitroGLYCERIN 5 mcg/min (08/26/13 0843)     Marinda Elk  [facility] Pager [phone]. If 8PM-8AM, please contact night-coverage at [email or web address], password [email or web address] 08/28/2013, 12:49 PM  LOS: 2 days

## 2013-08-28 NOTE — Progress Notes (Signed)
Nursing note Progress Patient really wishing/ wanting to be discharged today, Dr David Stall, paged and made aware stating he would be in to see patient. Tarez Bowns, Randall An RN

## 2013-08-28 NOTE — Progress Notes (Signed)
Nursing note DCd telemetry per MD order will continue to monitor patient. Suvan Stcyr, Randall An RN

## 2013-08-29 DIAGNOSIS — N400 Enlarged prostate without lower urinary tract symptoms: Secondary | ICD-10-CM

## 2013-08-29 DIAGNOSIS — Z940 Kidney transplant status: Secondary | ICD-10-CM

## 2013-08-29 DIAGNOSIS — K219 Gastro-esophageal reflux disease without esophagitis: Secondary | ICD-10-CM

## 2013-08-29 DIAGNOSIS — J811 Chronic pulmonary edema: Secondary | ICD-10-CM

## 2013-08-29 DIAGNOSIS — E785 Hyperlipidemia, unspecified: Secondary | ICD-10-CM

## 2013-08-29 DIAGNOSIS — R0609 Other forms of dyspnea: Secondary | ICD-10-CM

## 2013-08-29 LAB — BASIC METABOLIC PANEL
Calcium: 8.9 mg/dL (ref 8.4–10.5)
Creatinine, Ser: 1.52 mg/dL — ABNORMAL HIGH (ref 0.50–1.35)
GFR calc Af Amer: 52 mL/min — ABNORMAL LOW (ref >90)
GFR calc non Af Amer: 45 mL/min — ABNORMAL LOW (ref >90)

## 2013-08-29 MED ORDER — ASPIRIN EC 81 MG PO TBEC: 81.0000 mg | DELAYED_RELEASE_TABLET | Freq: Every day | ORAL | Status: DC

## 2013-08-29 MED ORDER — FUROSEMIDE 20 MG PO TABS: 20.0000 mg | ORAL_TABLET | Freq: Every day | ORAL | Status: DC

## 2013-08-29 NOTE — Progress Notes (Signed)
Nursing note Patient given AVS medication list and discharge instructions. pts prescriptions sent to pharmacy so no paper prescriptions given to patient. Will discharge home as ordered. Adna Nofziger, Randall An  rN

## 2013-08-29 NOTE — Discharge Summary (Signed)
Physician Discharge Summary  Jon Burns ZOX:[id number] DOB: 10-Dec-1940 DOA: 08/26/2013  PCP: Marin Comment, FNP  Admit date: 08/26/2013 Discharge date: 08/29/2013  Time spent: 35 minutes  Recommendations for Outpatient Follow-up:  Patient should follow with his primary care physician within one week of discharge. Patient feels hopeless cardiologist within one week of discharge. He should continue watching his fluid intake as well as follow a low sodium diet. This was discussed with the patient he does understand agree.  Discharge Diagnoses:  Principal Problem:   Acute diastolic heart failure with pulmonary edema, improved Active Problems:   HTN (hypertension)   Diabetes mellitus   History of renal transplantation   BPH (benign prostatic hyperplasia)   Bruit   Pulmonary edema cardiac cause   Respiratory failure   Acute pulmonary edema   Discharge Condition: Stable  Diet recommendation: low sodium/cardiac healthy, fluid restriction per day  Filed Weights   08/27/13 1500 08/28/13 0500  Weight: 95.255 kg (210 lb) 95.5 kg (210 lb 8.6 oz)    History of present illness:  Jon Burns is an 72 y.o. male with hx of DM, ESRD s/p renal transplant (never was on dialysis), hx of CHF, HTN, hyperlipidemia, presents to the ER in respiratory distress. Wife stated he has non productive coughs for 4 days, but last night became acutely short of breath. He had no chest pain, fever, chills, leg pain or swelling. In the ER, he was placed on Bipap and felt markedly better. Evalaution in the ER included a CXR which showed vascular congestion consistent with pulmonary edema (cannot exclude PNA), his EKG showed ST with some ST depression on the lateral leads. His BP was soft, but he was mentating well, and wife said his BP is usually low. He has no leukocytosis, normal Hb, and Cr of 1.7. He was given IV lasix of 80mg , and diused well and felt better. Hospitalist was asked to admit him for  "flash" pulmonary edema.  Hospital Course:  This is a 72 year old male with a history of diabetes mellitus, end-stage renal disease status post renal transplant, history of diastolic CHF, hypertension that presented to the emergency department in respiratory distress. Patient had developed a nonproductive cough for approximately 4 days before coming in, patient never had any shortness of breath leg swelling or pain. He was initially placed on BiPAP in the emergency department and was improving. His chest x-ray did show some vascular congestion which is consistent with pulmonary edema. Patient did have a mild elevation in his troponins which were trending downward. 2-D echocardiogram was conducted showing no wall motion abnormalities. Patient was placed on strict input and output 8 as well as daily weights. Patient was placed on Lasix which was transitioned to oral. She does have a grade 1 diastolic dysfunction. Which is likely the reasoning for his pulmonary edema. He was again diuresed successfully with IV Lasix and resistant to by mouth Lasix. As for his diabetes mellitus, patient was continued on his home medications, history CBG is remained stable. Patient does have a history of renal transplant, his creatinine did remain around baseline of 1.5. His home medications were also continued. Patient also has a history of hypertension which remained stable. Patient was instructed to watch his fluid intake, daily weights, and to followup with his primary care physician as well as cardiologist. On day of discharge, patient was seen examined and found to be stable. Patient is adamant in wanting to go home. He was given explicit instructions as to when to  return to emergency Department or to call his physician. This was discussed with the patient he does agree and understand.  Procedures: Echocardiogram Study Conclusions - Left ventricle: The cavity size was normal. Wall thickness was normal. Systolic function was  normal. The estimated ejection fraction was in the range of 50% to 55%. Wall motion was normal; there were no regional wall motion abnormalities. Doppler parameters are consistent with abnormal left ventricular relaxation (grade 1 diastolic dysfunction). - Left atrium: The atrium was mildly dilated. - Right atrium: The atrium was mildly dilated. - Atrial septum: No defect or patent foramen ovale was identified.  Consultations: None  Discharge Exam: Filed Vitals:   08/29/13 0425  BP: 154/75  Pulse: 71  Temp: 98 F (36.7 C)  Resp: 18     General: Well developed, well nourished, NAD, appears stated age  HEENT: NCAT, PERRLA, EOMI, Anicteic Sclera, mucous membranes moist. No pharyngeal erythema or exudates  Neck: Supple, no JVD, no masses  Cardiovascular: S1 S2 auscultated, no rubs, murmurs or gallops. Regular rate and rhythm.  Respiratory: Clear to auscultation bilaterally with equal chest rise  Abdomen: Soft, nontender, nondistended, + bowel sounds  Extremities: warm dry without cyanosis clubbing  Neuro: AAOx3, cranial nerves grossly intact. Strength 5/5 in patient's upper and lower extremities bilaterally  Skin: Without rashes exudates or nodules  Psych: Normal affect and demeanor with intact judgement and insight  Discharge Instructions  Discharge Orders   Future Orders Complete By Expires   (HEART FAILURE PATIENTS) Call MD:  Anytime you have any of the following symptoms: 1) 3 pound weight gain in 24 hours or 5 pounds in 1 week 2) shortness of breath, with or without a dry hacking cough 3) swelling in the hands, feet or stomach 4) if you have to sleep on extra pillows at night in order to breathe.  As directed    Diet - low sodium heart healthy  As directed    Discharge instructions  As directed    Comments:     Patient should follow with his primary care physician within one week of discharge. Patient feels hopeless cardiologist within one week of discharge. He should  continue watching his fluid intake as well as follow a low sodium diet. This was discussed with the patient he does understand agree.   Increase activity slowly  As directed        Medication List         allopurinol 100 MG tablet  Commonly known as:  ZYLOPRIM  Take 100 mg by mouth every morning.     aspirin EC 81 MG tablet  Take 1 tablet (81 mg total) by mouth daily.     CALTRATE 600+D PO  Take 1 tablet by mouth 2 (two) times daily.     cyanocobalamin 1000 MCG tablet  Take 1,000 mcg by mouth daily.     finasteride 5 MG tablet  Commonly known as:  PROSCAR  Take 5 mg by mouth daily.     furosemide 20 MG tablet  Commonly known as:  LASIX  Take 1 tablet (20 mg total) by mouth daily.     gabapentin 300 MG capsule  Commonly known as:  NEURONTIN  Take 300 mg by mouth daily.     HYDROcodone-acetaminophen 10-325 MG per tablet  Commonly known as:  NORCO  Take 1 tablet by mouth every 6 (six) hours as needed for moderate pain.     insulin NPH 100 UNIT/ML injection  Commonly known as:  HUMULIN N,NOVOLIN N  Inject 20-25 Units into the skin at bedtime.     insulin regular 100 units/mL injection  Commonly known as:  NOVOLIN R,HUMULIN R  Inject 12-18 Units into the skin 3 (three) times daily before meals.     lisinopril 5 MG tablet  Commonly known as:  PRINIVIL,ZESTRIL  Take 2.5 mg by mouth 2 (two) times daily.     mycophenolate 250 MG capsule  Commonly known as:  CELLCEPT  Take 500 mg by mouth 2 (two) times daily.     omeprazole 20 MG capsule  Commonly known as:  PRILOSEC  Take 20 mg by mouth daily.     predniSONE 5 MG tablet  Commonly known as:  DELTASONE  Take 5 mg by mouth daily.     simvastatin 40 MG tablet  Commonly known as:  ZOCOR  Take 20 mg by mouth every evening.     tacrolimus 1 MG capsule  Commonly known as:  PROGRAF  Take 3 mg by mouth 2 (two) times daily.     terazosin 10 MG capsule  Commonly known as:  HYTRIN  Take 10 mg by mouth daily.        No Known Allergies     Follow-up Information   Follow up with Marin Comment, FNP. Schedule an appointment as soon as possible for a visit in 1 week.   Specialty:  Nurse Practitioner   Contact information:   [address] [place] [address] [phone]       Follow up with Charlton Haws, MD. Schedule an appointment as soon as possible for a visit in 1 week.   Specialty:  Cardiology   Contact information:   [address] [place] [address] [phone]        The results of significant diagnostics from this hospitalization (including imaging, microbiology, ancillary and laboratory) are listed below for reference.    Significant Diagnostic Studies: Dg Chest Port 1 View  08/27/2013   CLINICAL DATA:  Chest tightness, cough and congestion.  EXAM: PORTABLE CHEST - 1 VIEW  COMPARISON:  Chest radiograph performed 08/26/2013  FINDINGS: The lungs are well-aerated. Vascular congestion is noted, with mildly increased interstitial markings, raising question for minimal interstitial edema. There is no evidence of pleural effusion or pneumothorax.  The cardiomediastinal silhouette is borderline normal in size. No acute osseous abnormalities are seen.  IMPRESSION: Vascular congestion, with mildly increased interstitial markings, raising question for minimal interstitial edema.   Electronically Signed   By: Roanna Raider M.D.   On: 08/27/2013 04:38   Dg Chest Portable 1 View  08/26/2013   CLINICAL DATA:  Respiratory distress.  EXAM: PORTABLE CHEST - 1 VIEW  COMPARISON:  Chest radiograph performed 06/17/2013  FINDINGS: The lungs are well-aerated. Vascular congestion is noted, with bilateral central airspace opacities, compatible with pulmonary edema. Superimposed pneumonia cannot be excluded. There is no evidence of pleural effusion or pneumothorax. A 1.3 cm nodule is again noted in the left midlung zone.  The cardiomediastinal silhouette is within normal limits. No acute  osseous abnormalities are seen.  IMPRESSION: 1. Vascular congestion, with bilateral central airspace opacities, compatible with pulmonary edema. Superimposed pneumonia cannot be excluded. 2. 1.3 cm nodule again noted at the left midlung zone. This has been present since April, and contrast enhanced CT of the chest was recommended for further evaluation. Would correlate as to whether a CT of the chest was performed, and if not, would schedule the CT of the  chest on an elective non-emergent basis.   Electronically Signed   By: Roanna Raider M.D.   On: 08/26/2013 05:48    Microbiology: Recent Results (from the past 240 hour(s))  MRSA PCR SCREENING     Status: None   Collection Time    08/26/13  8:54 AM      Result Value Range Status   MRSA by PCR NEGATIVE  NEGATIVE Final   Comment:            The GeneXpert MRSA Assay (FDA     approved for NASAL specimens     only), is one component of a     comprehensive MRSA colonization     surveillance program. It is not     intended to diagnose MRSA     infection nor to guide or     monitor treatment for     MRSA infections.     Labs: Basic Metabolic Panel:  Recent Labs Lab 08/26/13 0452 08/26/13 1050 08/27/13 0910 08/28/13 0500 08/29/13 0515  NA 139  --  137 137 137  K 4.4  --  4.2 3.9 4.0  CL 102  --  98 97 98  CO2 24  --  29 30 30   GLUCOSE 91  --  142* 87 101*  BUN 33*  --  35* 38* 40*  CREATININE 1.72* 1.54* 1.55* 1.54* 1.52*  CALCIUM 8.4  --  8.5 8.8 8.9   Liver Function Tests: No results found for this basename: AST, ALT, ALKPHOS, BILITOT, PROT, ALBUMIN,  in the last 168 hours No results found for this basename: LIPASE, AMYLASE,  in the last 168 hours No results found for this basename: AMMONIA,  in the last 168 hours CBC:  Recent Labs Lab 08/26/13 0452 08/26/13 1050 08/27/13 0910 08/28/13 0500  WBC 5.1 8.3 5.9 4.4  NEUTROABS 4.1  --  4.8  --   HGB 11.9* 10.8* 11.0* 11.1*  HCT 36.6* 33.0* 34.5* 35.1*  MCV 93.1 93.0  94.0 92.6  PLT 122* 112* 101* 110*   Cardiac Enzymes:  Recent Labs Lab 08/26/13 0920 08/26/13 1420 08/26/13 2005  TROPONINI 0.95* 1.57* 1.17*   BNP: BNP (last 3 results)  Recent Labs  05/16/13 0508 05/17/13 0425 08/26/13 0452  PROBNP 5098.0* 7029.0* 3270.0*   CBG:  Recent Labs Lab 08/28/13 0607 08/28/13 1100 08/28/13 1601 08/28/13 2118 08/29/13 0626  GLUCAP 92 129* 240* 204* 97       Signed:  Marlinda Miranda  [facility] 08/29/2013, 9:26 AM

## 2013-09-14 ENCOUNTER — Encounter: Payer: Self-pay | Admitting: Cardiovascular Disease

## 2013-09-14 ENCOUNTER — Ambulatory Visit (INDEPENDENT_AMBULATORY_CARE_PROVIDER_SITE_OTHER): Payer: Medicare HMO | Admitting: Cardiovascular Disease

## 2013-09-14 VITALS — BP 175/75 | HR 77 | Ht 73.0 in | Wt 226.0 lb

## 2013-09-14 DIAGNOSIS — E041 Nontoxic single thyroid nodule: Secondary | ICD-10-CM

## 2013-09-14 DIAGNOSIS — I10 Essential (primary) hypertension: Secondary | ICD-10-CM

## 2013-09-14 DIAGNOSIS — N400 Enlarged prostate without lower urinary tract symptoms: Secondary | ICD-10-CM

## 2013-09-14 DIAGNOSIS — E119 Type 2 diabetes mellitus without complications: Secondary | ICD-10-CM

## 2013-09-14 DIAGNOSIS — I5031 Acute diastolic (congestive) heart failure: Secondary | ICD-10-CM

## 2013-09-14 DIAGNOSIS — R911 Solitary pulmonary nodule: Secondary | ICD-10-CM

## 2013-09-14 DIAGNOSIS — R0989 Other specified symptoms and signs involving the circulatory and respiratory systems: Secondary | ICD-10-CM

## 2013-09-14 DIAGNOSIS — Z940 Kidney transplant status: Secondary | ICD-10-CM

## 2013-09-14 NOTE — Assessment & Plan Note (Addendum)
Baseline Cr 1.5  Hydrate before any dye f/U [facility]  On prednisone and prograf  Has had [place] with [facility] to look at vascular limbs

## 2013-09-14 NOTE — Patient Instructions (Signed)
Your physician wants you to follow-up in:  6 MONTHS WITH DR NISHAN  You will receive a reminder letter in the mail two months in advance. If you don't receive a letter, please call our office to schedule the follow-up appointment. Your physician recommends that you continue on your current medications as directed. Please refer to the Current Medication list given to you today. 

## 2013-09-14 NOTE — Assessment & Plan Note (Signed)
F/U primary apparently will scan with lung nodule in 2 months per Dr Sherene Sires No goiter on exam

## 2013-09-14 NOTE — Progress Notes (Signed)
Patient ID: Jon Burns, male   DOB: [birth year], 73 y.o.   MRN: [id number] 73 yo patient of Dr Ellamae Sia Seen in hospital a month ago PMH significant for a kidney transplant (3 yrs ago at [facility]) due to ESRD from diabetes mellitus, HTN, and a former h/o tobacco abuse (3 ppd x 40 years, quit 15 yrs ago). In about April of this year, he had been on at least 2-3 antihypertensive medications along with Lasix, but as he apparently became fatigued and volume depleted, his Lasix was reduced to 20 mg daily and his amlodipine was discontinued.  On the morning of 05/15/13, he had some SOB which was relieved with a nebulized albuterol treatment. He denied any associated chest pain or diaphoresis.  This morning at approximately 3 AM, he again felt significantly short of breath but denied concomitant chest pain, diaphoresis, and palpitations.  He was brought to the [facility] ED and as per the patient, his wife, and the medical record, after he was given 1 dose of IV Lasix 20 mg, his symptoms resolved.  An echocardiogram performed at [facility] on 02-02-2013 showed normal LV systolic function, EF>55%, with severe concentric LVH and mild LAE (no comment on diastolic function or LVEDP).  His stress test was submaximal in that he did not reach an adequate heart rate, although no inducible wall motion abnormalities were noted.   Hospitalized 12/14 with similar diastolic dysfunction and pulmonary edema  Meds adjusted by hospitalist Echo EF 50-55% grade one diastolic dysfunction no valve disease   Carotid disease 60-70% by duplex CTA 50% on left  IMPRESSION: Heavily calcified 50% stenosis left internal carotid artery origin.  Non stenotic atheromatous change right carotid bifurcation.  Heavily calcified plaque at the origin of both subclavians could be flow reducing, correlate with blood pressure measurements for symptoms of subclavian steal.  8 x 12 mm left upper lobe lesion not visible on chest x-ray concerning for  neoplasm. CT chest with contrast recommended for further evaluation.  16 x 18 mm left lobe thyroid lesion. Consider sonographic evaluation and biopsy.  Dr Sherene Sires has seen him and will do f/u CT in 2 months Dr Anner Crete in [place] follows his general issues Has close f/[facility] with [place] this year    ROS: Denies fever, malais, weight loss, blurry vision, decreased visual acuity, cough, sputum, SOB, hemoptysis, pleuritic pain, palpitaitons, heartburn, abdominal pain, melena, lower extremity edema, claudication, or rash.  All other systems reviewed and negative  General: Affect appropriate Chronically ill white male  HEENT: normal Neck supple with no adenopathy JVP normal bilateral carotid  bruits no thyromegaly Lungs clear with no wheezing and good diaphragmatic motion Heart:  S1/S2 SEM  murmur, no rub, gallop or click PMI normal Abdomen: benighn, BS positve, no tenderness, no AAA no bruit.  No HSM or HJR Distal pulses intact with no bruits No edema Neuro non-focal Skin warm and dry No muscular weakness Fistula LUE with thrill never used    Current Outpatient Prescriptions  Medication Sig Dispense Refill  . allopurinol (ZYLOPRIM) 100 MG tablet Take 100 mg by mouth every morning.      Marland Kitchen aspirin EC 81 MG tablet Take 1 tab three timed a week      . Calcium Carbonate-Vitamin D (CALTRATE 600+D PO) Take 1 tablet by mouth 2 (two) times daily.      . cyanocobalamin 1000 MCG tablet Take 1,000 mcg by mouth daily.      . finasteride (PROSCAR) 5 MG tablet  Take 5 mg by mouth daily.      . furosemide (LASIX) 20 MG tablet Take 10 mg by mouth daily.      Marland Kitchen. gabapentin (NEURONTIN) 300 MG capsule Take 300 mg by mouth daily.      Marland Kitchen. HYDROcodone-acetaminophen (NORCO) 10-325 MG per tablet Take 1 tablet by mouth every 6 (six) hours as needed for moderate pain.      Marland Kitchen. insulin NPH (HUMULIN N,NOVOLIN N) 100 UNIT/ML injection Inject 20-25 Units into the skin at bedtime.      . insulin  regular (NOVOLIN R,HUMULIN R) 100 units/mL injection Inject 12-18 Units into the skin 3 (three) times daily before meals.      Marland Kitchen. lisinopril (PRINIVIL,ZESTRIL) 5 MG tablet Take 2.5 mg by mouth 2 (two) times daily.      . mycophenolate (CELLCEPT) 250 MG capsule Take 500 mg by mouth 2 (two) times daily.       Marland Kitchen. omeprazole (PRILOSEC) 20 MG capsule Take 20 mg by mouth daily.      . predniSONE (DELTASONE) 5 MG tablet Take 5 mg by mouth daily.      . simvastatin (ZOCOR) 40 MG tablet Take 20 mg by mouth every evening.      . tacrolimus (PROGRAF) 1 MG capsule Take 3 mg by mouth 2 (two) times daily.       Marland Kitchen. terazosin (HYTRIN) 10 MG capsule Take 10 mg by mouth daily.       . traMADol (ULTRAM) 50 MG tablet As needed       No current facility-administered medications for this visit.    Allergies  Review of patient's allergies indicates no known allergies.  Electrocardiogram:  12/25 ST LVH   Assessment and Plan

## 2013-09-14 NOTE — Assessment & Plan Note (Signed)
F/U CT in 2 months Former smoker sees Systems developer for this

## 2013-09-14 NOTE — Assessment & Plan Note (Signed)
No hesitancy On hytrin for this and HTN  Stable

## 2013-09-14 NOTE — Assessment & Plan Note (Signed)
High in office However patient take BP at home twice a day and it runs 115-120 mmHg systolic.  Continue current meds Lower salt intake  Would add hydralazine if needed in future

## 2013-09-14 NOTE — Assessment & Plan Note (Signed)
Resolved He had liberalized his salt over holidays and primary had cut his lasix back due to lethargy  Discussed sliding scale lasix  Baseline weight at home around 216

## 2013-09-14 NOTE — Assessment & Plan Note (Signed)
Discussed low carb diet.  Target hemoglobin A1c is 6.5 or less.  Continue current medications.  

## 2013-09-14 NOTE — Assessment & Plan Note (Signed)
No change on exam  CT not high grade f/u duplex 6 months ASA

## 2013-09-29 ENCOUNTER — Encounter: Payer: Self-pay | Admitting: Podiatry

## 2013-09-29 ENCOUNTER — Ambulatory Visit (INDEPENDENT_AMBULATORY_CARE_PROVIDER_SITE_OTHER): Payer: Medicare HMO | Admitting: Podiatry

## 2013-09-29 VITALS — BP 149/77 | HR 77 | Resp 18

## 2013-09-29 DIAGNOSIS — B351 Tinea unguium: Secondary | ICD-10-CM

## 2013-09-29 DIAGNOSIS — M79609 Pain in unspecified limb: Secondary | ICD-10-CM

## 2013-09-29 NOTE — Progress Notes (Signed)
   Subjective:    Patient ID: Jon Burns, male    DOB: 1941/07/26, 73 y.o.   MRN: [id number]  HPI I need to trim my nails and the left big toenail is sore and about a year ago and I have been a diabetic since 1981 and throbbing and burning and I do have neuropathy     Review of Systems  Respiratory: Positive for cough.   Psychiatric/Behavioral: Positive for behavioral problems.  All other systems reviewed and are negative.       Objective:   Physical Exam Orientated x73 year old white male Vascular: The DP is are 2/4 bilaterally. PTs 0/4 bilaterally  Neurological: Sensation to 10 g monofilament wire 9/10 right and 10/10 left. Vibratory sensation is diminished bilaterally  Dermatological: Hypertrophic, elongated, discolored and incurvated toenails with palpable tenderness in all 10 nail plates  Musculoskeletal: Hammertoes of the lateral toes bilaterally     Assessment & Plan:   Assessment: Peripheral arterial disease because of diminished pedal pulses Diabetic peripheral neuropathy Symptomatic onychomycoses x10  Plan: All 10 toenails debrided without a bleeding. [facility] arterial Doppler for the indication of diminished pedal pulses. Reappoint at three-month intervals.

## 2013-09-29 NOTE — Patient Instructions (Addendum)
Diabetes and Foot Care  Request an arterial Doppler from the [facility] or your cardiologist for the indication of decreased pedal pulses bilaterally  Diabetes may cause you to have problems because of poor blood supply (circulation) to your feet and legs. This may cause the skin on your feet to become thinner, break easier, and heal more slowly. Your skin may become dry, and the skin may peel and crack. You may also have nerve damage in your legs and feet causing decreased feeling in them. You may not notice minor injuries to your feet that could lead to infections or more serious problems. Taking care of your feet is one of the most important things you can do for yourself.  HOME CARE INSTRUCTIONS  Wear shoes at all times, even in the house. Do not go barefoot. Bare feet are easily injured.  Check your feet daily for blisters, cuts, and redness. If you cannot see the bottom of your feet, use a mirror or ask someone for help.  Wash your feet with warm water (do not use hot water) and mild soap. Then pat your feet and the areas between your toes until they are completely dry. Do not soak your feet as this can dry your skin.  Apply a moisturizing lotion or petroleum jelly (that does not contain alcohol and is unscented) to the skin on your feet and to dry, brittle toenails. Do not apply lotion between your toes.  Trim your toenails straight across. Do not dig under them or around the cuticle. File the edges of your nails with an emery board or nail file.  Do not cut corns or calluses or try to remove them with medicine.  Wear clean socks or stockings every day. Make sure they are not too tight. Do not wear knee-high stockings since they may decrease blood flow to your legs.  Wear shoes that fit properly and have enough cushioning. To break in new shoes, wear them for just a few hours a day. This prevents you from injuring your feet. Always look in your shoes before you put them on to be sure there are no  objects inside.  Do not cross your legs. This may decrease the blood flow to your feet.  If you find a minor scrape, cut, or break in the skin on your feet, keep it and the skin around it clean and dry. These areas may be cleansed with mild soap and water. Do not cleanse the area with peroxide, alcohol, or iodine.  When you remove an adhesive bandage, be sure not to damage the skin around it.  If you have a wound, look at it several times a day to make sure it is healing.  Do not use heating pads or hot water bottles. They may burn your skin. If you have lost feeling in your feet or legs, you may not know it is happening until it is too late.  Make sure your health care provider performs a complete foot exam at least annually or more often if you have foot problems. Report any cuts, sores, or bruises to your health care provider immediately. SEEK MEDICAL CARE IF:   You have an injury that is not healing.  You have cuts or breaks in the skin.  You have an ingrown nail.  You notice redness on your legs or feet.  You feel burning or tingling in your legs or feet.  You have pain or cramps in your legs and feet.  Your legs or  feet are numb.  Your feet always feel cold. SEEK IMMEDIATE MEDICAL CARE IF:   There is increasing redness, swelling, or pain in or around a wound.  There is a red line that goes up your leg.  Pus is coming from a wound.  You develop a fever or as directed by your health care provider.  You notice a bad smell coming from an ulcer or wound. Document Released: 08/16/2000 Document Revised: 04/21/2013 Document Reviewed: 01/26/2013 [facility] Patient Information 2014 [place].

## 2013-09-30 ENCOUNTER — Telehealth: Payer: Self-pay | Admitting: *Deleted

## 2013-09-30 DIAGNOSIS — R911 Solitary pulmonary nodule: Secondary | ICD-10-CM

## 2013-09-30 NOTE — Telephone Encounter (Signed)
Order sent to [facility] 

## 2013-09-30 NOTE — Telephone Encounter (Signed)
Message copied by Christen Butter on Thu Sep 30, 2013  1:15 PM ------      Message from: Sandrea Hughs B      Created: Tue Jun 29, 2013  6:05 AM       Needs chest ct no contrast f/u nodules ------

## 2013-09-30 NOTE — Telephone Encounter (Signed)
Message copied by Christen Butter on Thu Sep 30, 2013  1:19 PM ------      Message from: Sandrea Hughs B      Created: Tue Jul 06, 2013  1:54 PM       Ct chest s contrast eval pulmonary nodules  ------

## 2013-10-11 ENCOUNTER — Other Ambulatory Visit: Payer: Medicare Other

## 2013-10-11 ENCOUNTER — Ambulatory Visit (INDEPENDENT_AMBULATORY_CARE_PROVIDER_SITE_OTHER): Payer: Medicare HMO | Admitting: Internal Medicine

## 2013-10-11 ENCOUNTER — Ambulatory Visit (INDEPENDENT_AMBULATORY_CARE_PROVIDER_SITE_OTHER)
Admission: RE | Admit: 2013-10-11 | Discharge: 2013-10-11 | Disposition: A | Discharge status: Home / Self Care | Payer: Medicare HMO | Source: Ambulatory Visit | Attending: Internal Medicine | Admitting: Internal Medicine

## 2013-10-11 ENCOUNTER — Encounter: Payer: Self-pay | Admitting: Internal Medicine

## 2013-10-11 VITALS — BP 120/74 | HR 70 | Temp 97.9°F | Ht 73.0 in | Wt 222.0 lb

## 2013-10-11 DIAGNOSIS — R059 Cough, unspecified: Secondary | ICD-10-CM

## 2013-10-11 DIAGNOSIS — R05 Cough: Secondary | ICD-10-CM

## 2013-10-11 DIAGNOSIS — R911 Solitary pulmonary nodule: Secondary | ICD-10-CM

## 2013-10-11 DIAGNOSIS — I10 Essential (primary) hypertension: Secondary | ICD-10-CM

## 2013-10-11 DIAGNOSIS — R053 Chronic cough: Secondary | ICD-10-CM

## 2013-10-11 MED ORDER — IRBESARTAN 75 MG PO TABS: 75.0000 mg | ORAL_TABLET | Freq: Every day | ORAL | Status: DC

## 2013-10-11 NOTE — Assessment & Plan Note (Signed)
Since he has nl pfts and did not respond to abx and steroids more likely this is  Classic Upper airway cough syndrome, so named because it's frequently impossible to sort out how much is  CR/sinusitis with freq throat clearing (which can be related to primary GERD)   vs  causing  secondary (" extra esophageal")  GERD from wide swings in gastric pressure that occur with throat clearing, often  promoting self use of mint and menthol lozenges that reduce the lower esophageal sphincter tone and exacerbate the problem further in a cyclical fashion.   These are the same pts (now being labeled as having "irritable larynx syndrome" by some cough centers) who not infrequently have a history of having failed to tolerate ace inhibitors,  dry powder inhalers or biphosphonates or report having atypical reflux symptoms that don't respond to standard doses of PPI , and are easily confused as having aecopd or asthma flares by even experienced allergists/ pulmonologists.   rec trial of arb (see hbp) the only way to know  the cause of uacs in this setting

## 2013-10-11 NOTE — Assessment & Plan Note (Signed)
-   CT neck 06/28/13 8 x 12 mm left upper lobe  CT chest 10/11/2013  Noncalcified nodules in the left upper lobe measure 9 x 10 mm (image  23) and 7 x 11 mm (image 30) > rec recheck 6 months(placed in tickle file)   Pt is a very remote smoker and has multiple nodules either of which could prove to be malignant but the larger of the two has not grown in 4 months so ok to recheck in 6 months  Discussed in detail all the  indications, usual  risks and alternatives  relative to the benefits with patient who agrees to proceed with conservative f/u

## 2013-10-11 NOTE — Progress Notes (Signed)
Subjective:    Patient ID: Jon Burns, male    DOB: 1940/11/17  MRN: [id number]  Brief patient profile:  70 yowm quit smoking 1994 because his wife nagged him with no resp problems but limited by feet chronically then abrupt onst sob admit to [facility] with dx of copd but had nl pft's 06/17/13    History of Present Illness  Admission date: 05/16/2013 Admitting Physician Jon Norman, MD  Discharge Date: 05/18/2013  Primary MD Jon Comment, FNP  Admission Diagnosis COPD (chronic obstructive pulmonary disease) [496]  Chronic cough [786.2]  CHF (congestive heart failure) [428.0]  History of renal transplant [V42.0]  Diabetes mellitus [250.00]  Discharge Diagnosis  Active Problems:  CHF (congestive heart failure)  HTN (hypertension)  Diabetes mellitus  Dyslipidemia  Chronic cough  History of renal transplantation  GERD (gastroesophageal reflux disease)  BPH (benign prostatic hyperplasia)    06/17/2013 1st [facility] office visit/ Jon Burns  cc breathing better since got fluid off > no longer swelling,  Coughing on acei x since April 2014, dry, assoc with urge to clear throat and hoarsness but not bothering him at hs.  Not limited by breathing before abrupt onset prior to above admit and not limiting him since with complete resolution of his leg swelling also. rec You do not have significant lung damage from smoking and you never will based on your present lung function Your lisinopril may be making you clear your throat / cough/ hoarse but it's not severe and can be addressed later by whoever prescribes it.    07/06/2013 f/u ov/Jon Burns re: f/u pulmonary nodule Chief Complaint  Patient presents with  . Follow-up    Pt states referred per Dr Eden Emms for eval of lung lesion noted on recent ct chest. His breathing is doing well and he denies any co's today.     Now able to walk at [employer],  Slow pace. No need for resp meds rec Repeat in 3 months/ consider d/c lisinopril   10/11/2013  f/u ov/Jon Burns re:  Chief Complaint  Patient presents with  . Follow-up    Pt states cough worse since end of Dec 2014. Cough is non prod and he has frequent throat clearing.    tol treadmill x 300 min x at zero grade   More cough day than night, more dry than wet, already rx abx and steroids no better.   No obvious day to day or daytime variabilty or assoc  cp or chest tightness, subjective wheeze overt sinus or hb symptoms. No unusual exp hx or h/o childhood pna/ asthma or knowledge of premature birth.  Sleeping ok without nocturnal  or early am exacerbation  of respiratory  c/o's or need for noct saba. Also denies any obvious fluctuation of symptoms with weather or environmental changes or other aggravating or alleviating factors except as outlined above   Current Medications, Allergies, Complete Past Medical History, Past Surgical History, Family History, and Social History were reviewed in [employer] record.  ROS  The following are not active complaints unless bolded sore throat, dysphagia, dental problems, itching, sneezing,  nasal congestion or excess/ purulent secretions, ear ache,   fever, chills, sweats, unintended wt loss, pleuritic or exertional cp, hemoptysis,  orthopnea pnd or leg swelling, presyncope, palpitations, heartburn, abdominal pain, anorexia, nausea, vomiting, diarrhea  or change in bowel or urinary habits, change in stools or urine, dysuria,hematuria,  rash, arthralgias, visual complaints, headache, numbness weakness or ataxia or problems with walking or coordination,  change in mood/affect or memory.               Objective:   Physical Exam  Pleasant amb wm  Raspy voice, freq  cough with voice use or deep breath   07/06/2013        225  >  222 10/11/2013  Wt Readings from Last 3 Encounters:  06/17/13 221 lb (100.245 kg)  06/15/13 218 lb (98.884 kg)  05/18/13 223 lb 12.8 oz (101.515 kg)      HEENT: nl dentition, turbinates, and  orophanx. Nl external ear canals without cough reflex   NECK :  without JVD/Nodes/TM/ nl carotid upstrokes bilaterally   LUNGS: no acc muscle use, clear to A and P bilaterally without cough on insp or exp maneuvers   CV:  RRR  no s3 or murmur or increase in P2, no edema   ABD:  soft and nontender with nl excursion in the supine position. No bruits or organomegaly, bowel sounds nl  MS:  warm without deformities, calf tenderness, cyanosis or clubbing  SKIN: warm and dry without lesions         CT chest 10/11/2013  Noncalcified nodules in the left upper lobe measure 9 x 10 mm (image  23) and 7 x 11 mm (image 30).  (no change in the upper from 06/2013 seen on neck scan)        Assessment & Plan:

## 2013-10-11 NOTE — Assessment & Plan Note (Signed)
ACE inhibitors are problematic in  pts with airway complaints because  even experienced pulmonologists can't always distinguish ace effects from copd/asthma.  By themselves they don't actually cause a problem, much like oxygen can't by itself start a fire, but they certainly serve as a powerful catalyst or enhancer for any "fire"  or inflammatory process in the upper airway, be it caused by an ET  tube or more commonly reflux (especially in the obese or pts with known GERD or who are on biphoshonates).    In the era of ARB near equivalency until we have a better handle on the reversibility of the airway problem, it just makes sense to avoid ACEI  entirely in the short run and then decide later, having established a level of airway control using a reasonable limited regimen, whether to add back ace but even then being very careful to observe the pt for worsening airway control and number of meds used/ needed to control symptoms.    For now try avapro 75 mg ok to use 150 if not well controlled   See instructions for specific recommendations which were reviewed directly with the patient who was given a copy with highlighter outlining the key components.

## 2013-10-11 NOTE — Patient Instructions (Addendum)
Stop lisinopril   Start avapro 75 mg daily - ok to break in half if too strong, ok to take twice daily if too weak   I will call with the recommendation by radiology

## 2013-10-13 ENCOUNTER — Telehealth: Payer: Self-pay | Admitting: Internal Medicine

## 2013-10-13 NOTE — Telephone Encounter (Signed)
I spoke with the pt spouse and she states that the pt took 1 whole tablet of avapro this morning and his BP was running low. Per instructions MW states if one tablet drops BP too low then to try half a tablet. They have not done this so I advised in the morning to take 1/2 a tablet and if BP still running low to call us back. Carron Curie, CMA

## 2013-10-14 ENCOUNTER — Telehealth: Payer: Self-pay | Admitting: Internal Medicine

## 2013-10-14 NOTE — Telephone Encounter (Signed)
Ov note 10/11/13: Stop lisinopril  Start avapro 75 mg daily - ok to break in half if too strong, ok to take twice daily if too weak  I will call with the recommendation by radiology  ---  Pt took 1 tablet of avapro yesterday and it was 60/45.  this AM his BP was 105/60. Pt took 1/2 tab of avapro BP at 11 Am was 70/62,  Then about 1 PM was 67/41. MW is not on the schedule so will forward to TP for recs. Please advise thanks  No Known Allergies

## 2013-10-14 NOTE — Telephone Encounter (Signed)
Spoke with pt /wife , no dizziness, presyncope/syncope.  Just feels tired. No chest pain, edema , headache .  Advised to rest w/ feet elevated. Push fluids  Advised if develop dizziness, presyncope/syncope, mental status changes, chest pain , n/v. Needs to call back or 911.  Hold proscar/terazosin (as may drop b/p) tomorrow.  No pain rx  Hold lasix in am , will call to check on to see if b/p improved.  (will worsen hypotension/hypovolemia)  Please contact office for sooner follow up if symptoms do not improve or worsen or seek emergency care  Remain off Avapro .  Once b/p is back up will decide on rx needed.  Case reviewed with Dr. Maple Hudson  In Dr. Sherene Sires  Absence

## 2013-10-15 ENCOUNTER — Telehealth: Payer: Self-pay | Admitting: Internal Medicine

## 2013-10-15 NOTE — Telephone Encounter (Signed)
Called to check on patient this morning.  Spoke with spouse who reported that pt is "feeling good" this morning.  BP was 124/66 at 5:30am today.  Pt not at home at this time (was 1045 at time of conversation) because he had gone out to eat breakfast with some friends.  Wife stated pt did not take meds as previously rec'd by TP.  Asked wife to check pt's BP when he returns home and call me back.  Wife returned call at approx 1030 and reported that pt had stopped at a pharmacy to check BP and received reading of 150/80.  Wife checked BP when he arrived home and it steadily dropped to 82/53.  Wife reported some dizziness.  Advised spouse per TP to continue to hold all meds, have pt sit down to rest, elevate legs and push fluids.  Will send to [facility] for further recommendations.  Please advise, thank you.  Per 2.12.15 phone note: Julio Sicks, NP at 10/14/2013  3:52 PM      Status: Signed            Spoke with pt Denton Lank , no dizziness, presyncope/syncope.   Just feels tired. No chest pain, edema , headache .   Advised to rest w/ feet elevated. Push fluids   Advised if develop dizziness, presyncope/syncope, mental status changes, chest pain , n/v. Needs to call back or 911.   Hold proscar/terazosin (as may drop b/p) tomorrow.   No pain rx   Hold lasix in am , will call to check on to see if b/p improved.   (will worsen hypotension/hypovolemia)   Please contact office for sooner follow up if symptoms do not improve or worsen or seek emergency care  Remain off Avapro .   Once b/p is back up will decide on rx needed.   Case reviewed with Dr. Maple Hudson  In Dr. Sherene Sires  Absence           Tommie Sams, CMA at 10/14/2013  3:19 PM      Status: Signed            Ov note 10/11/13: Stop lisinopril   Start avapro 75 mg daily - ok to break in half if too strong, ok to take twice daily if too weak   I will call with the recommendation by radiology   --- Pt took 1 tablet of avapro yesterday and it was 60/45.  this AM  his BP was 105/60. Pt took 1/2 tab of avapro BP at 11 Am was 70/62,  Then about 1 PM was 67/41. MW is not on the schedule so will forward to TP for recs. Please advise thanks  No Known Allergies

## 2013-10-15 NOTE — Telephone Encounter (Signed)
Clearly this is not related to bp meds or the ones Tammy told him to stop but for now would leave off bp meds and see [place] or primary with all meds in hand to sort out, to ER over weekend if condition worsens

## 2013-10-15 NOTE — Telephone Encounter (Signed)
Called and spoke with spouse. Aware of recs. appt scheduled w/ TP for Monday. Nothing further needed

## 2013-10-18 ENCOUNTER — Encounter: Payer: Self-pay | Admitting: Adult Health

## 2013-10-18 ENCOUNTER — Ambulatory Visit (INDEPENDENT_AMBULATORY_CARE_PROVIDER_SITE_OTHER): Payer: Medicare HMO | Admitting: Adult Health

## 2013-10-18 VITALS — BP 110/70 | HR 98 | Temp 98.2°F | Ht 73.0 in | Wt 218.8 lb

## 2013-10-18 DIAGNOSIS — R05 Cough: Secondary | ICD-10-CM

## 2013-10-18 DIAGNOSIS — R911 Solitary pulmonary nodule: Secondary | ICD-10-CM

## 2013-10-18 DIAGNOSIS — R053 Chronic cough: Secondary | ICD-10-CM

## 2013-10-18 DIAGNOSIS — R059 Cough, unspecified: Secondary | ICD-10-CM

## 2013-10-18 NOTE — Progress Notes (Signed)
Subjective:    Patient ID: Jon Burns, male    DOB: 10-19-1940  MRN: [id number]  Brief patient profile:  [age] yowm quit smoking 1994 because his wife nagged him with no resp problems but limited by feet chronically then abrupt onst sob [facility] with dx of copd but had nl pft's 06/17/13    History of Present Illness  Admission date: 05/16/2013 Admitting Physician Laveda Normanhris N Oti, MD  Discharge Date: 05/18/2013  Primary MD Marin CommentWELLS, CHERYL, FNP  Admission Diagnosis COPD (chronic obstructive pulmonary disease) [496]  Chronic cough [786.2]  CHF (congestive heart failure) [428.0]  History of renal transplant [V42.0]  Diabetes mellitus [250.00]  Discharge Diagnosis  Active Problems:  CHF (congestive heart failure)  HTN (hypertension)  Diabetes mellitus  Dyslipidemia  Chronic cough  History of renal transplantation  GERD (gastroesophageal reflux disease)  BPH (benign prostatic hyperplasia)    06/17/2013 1st [facility] office visit/ Wert  cc breathing better since got fluid off > no longer swelling,  Coughing on acei x since April 2014, dry, assoc with urge to clear throat and hoarsness but not bothering him at hs.  Not limited by breathing before abrupt onset prior to above admit and not limiting him since with complete resolution of his leg swelling also. rec You do not have significant lung damage from smoking and you never will based on your present lung function Your lisinopril may be making you clear your throat / cough/ hoarse but it's not severe and can be addressed later by whoever prescribes it.    07/06/2013 f/u ov/Wert re: f/u pulmonary nodule Chief Complaint  Patient presents with  . Follow-up    Pt states referred per Dr Eden EmmsNishan for eval of lung lesion noted on recent ct chest. His breathing is doing well and he denies any co's today.     Now able to walk at [employer],  Slow pace. No need for resp meds rec Repeat in 3 months/ consider d/c lisinopril   10/11/2013  f/u ov/Wert re:  Chief Complaint  Patient presents with  . Follow-up    Pt states cough worse since end of Dec 2014. Cough is non prod and he has frequent throat clearing.    tol treadmill x 300 min x 2mph at zero grade   More cough day than night, more dry than wet, already rx abx and steroids no better. >stop lisinopril , rx Avapro   10/18/2013 Follow up  Patient returns for a followup for cough. Patient was seen last visit and changed off of his ACE inhibitor over to Avapro. However, patient was unable to tolerate it due to severe hypotension. He has had labile blood pressures since being off of his medications with blood pressures as low as 70 and as high as 180. He is adamant about returning back to his original medication list. He says that his cough is totally resolved and he is feeling back to baseline. Other than his blood pressures being up and down. He denies any chest pain, palpitations, syncope, presyncope, edema.    Current Medications, Allergies, Complete Past Medical History, Past Surgical History, Family History, and Social History were reviewed in [facility] electronic medical record.  ROS  The following are not active complaints unless bolded sore throat, dysphagia, dental problems, itching, sneezing,  nasal congestion or excess/ purulent secretions, ear ache,   fever, chills, sweats, unintended wt loss, pleuritic or exertional cp, hemoptysis,  orthopnea pnd or leg swelling, presyncope, palpitations, heartburn, abdominal pain, anorexia,  nausea, vomiting, diarrhea  or change in bowel or urinary habits, change in stools or urine, dysuria,hematuria,  rash, arthralgias, visual complaints, headache, numbness weakness or ataxia or problems with walking or coordination,  change in mood/affect or memory.               Objective:   Physical Exam  Pleasant amb wm    07/06/2013        225  >  222 10/11/2013>218 10/18/2013      HEENT: nl dentition, turbinates, and orophanx.  Nl external ear canals without cough reflex   NECK :  without JVD/Nodes/TM/ nl carotid upstrokes bilaterally   LUNGS: no acc muscle use, clear to A and P bilaterally without cough on insp or exp maneuvers   CV:  RRR  no s3 or murmur or increase in P2, no edema   ABD:  soft and nontender with nl excursion in the supine position. No bruits or organomegaly, bowel sounds nl  MS:  warm without deformities, calf tenderness, cyanosis or clubbing  SKIN: warm and dry without lesions         CT chest 10/11/2013  Noncalcified nodules in the left upper lobe measure 9 x 10 mm (image  23) and 7 x 11 mm (image 30).  (no change in the upper from 06/2013 seen on neck scan)        Assessment & Plan:

## 2013-10-18 NOTE — Assessment & Plan Note (Signed)
Repeat CT chest in 6 month and follow up ov w/ Dr. Sherene Sires  To discuss

## 2013-10-18 NOTE — Assessment & Plan Note (Addendum)
Cough resolved off ACE however pt unable to tolerate ARB He is very adamant to return to his previous list as he was doing well.   Plan  may restart medications terazosin and finsateride and lasix  Remain off Avapro . May restart Lisinopril.  Call back if cough returns or becomes bothersome.  Follow up with Primary doctor in 2 weeks as planned

## 2013-10-18 NOTE — Patient Instructions (Addendum)
May restart medications terazosin and finsateride and lasix  Remain off Avapro . May restart Lisinopril.  Call back if cough returns or becomes bothersome.  Follow up with Primary doctor in 2 weeks as planned  Follow up 6 months with Dr. Sherene Sires  -will need CT scan in 6 months to follow nodule.

## 2013-10-28 ENCOUNTER — Emergency Department (HOSPITAL_COMMUNITY)
Admission: EM | Admit: 2013-10-28 | Discharge: 2013-10-28 | Disposition: A | Discharge status: Home / Self Care | Payer: Medicare HMO | Attending: Emergency Medicine | Admitting: Emergency Medicine

## 2013-10-28 ENCOUNTER — Encounter (HOSPITAL_COMMUNITY): Payer: Self-pay | Admitting: Emergency Medicine

## 2013-10-28 DIAGNOSIS — T148XXA Other injury of unspecified body region, initial encounter: Secondary | ICD-10-CM

## 2013-10-28 DIAGNOSIS — IMO0002 Reserved for concepts with insufficient information to code with codable children: Secondary | ICD-10-CM | POA: Insufficient documentation

## 2013-10-28 DIAGNOSIS — Z79899 Other long term (current) drug therapy: Secondary | ICD-10-CM | POA: Insufficient documentation

## 2013-10-28 DIAGNOSIS — R011 Cardiac murmur, unspecified: Secondary | ICD-10-CM | POA: Insufficient documentation

## 2013-10-28 DIAGNOSIS — R4182 Altered mental status, unspecified: Secondary | ICD-10-CM | POA: Insufficient documentation

## 2013-10-28 DIAGNOSIS — Z87891 Personal history of nicotine dependence: Secondary | ICD-10-CM | POA: Insufficient documentation

## 2013-10-28 DIAGNOSIS — F29 Unspecified psychosis not due to a substance or known physiological condition: Secondary | ICD-10-CM | POA: Insufficient documentation

## 2013-10-28 DIAGNOSIS — E162 Hypoglycemia, unspecified: Secondary | ICD-10-CM

## 2013-10-28 DIAGNOSIS — Y9372 Activity, wrestling: Secondary | ICD-10-CM | POA: Insufficient documentation

## 2013-10-28 DIAGNOSIS — Z7982 Long term (current) use of aspirin: Secondary | ICD-10-CM | POA: Insufficient documentation

## 2013-10-28 DIAGNOSIS — Y929 Unspecified place or not applicable: Secondary | ICD-10-CM | POA: Insufficient documentation

## 2013-10-28 DIAGNOSIS — Z8701 Personal history of pneumonia (recurrent): Secondary | ICD-10-CM | POA: Insufficient documentation

## 2013-10-28 DIAGNOSIS — Z87448 Personal history of other diseases of urinary system: Secondary | ICD-10-CM | POA: Insufficient documentation

## 2013-10-28 DIAGNOSIS — S51009A Unspecified open wound of unspecified elbow, initial encounter: Secondary | ICD-10-CM | POA: Insufficient documentation

## 2013-10-28 DIAGNOSIS — Z794 Long term (current) use of insulin: Secondary | ICD-10-CM | POA: Insufficient documentation

## 2013-10-28 DIAGNOSIS — K219 Gastro-esophageal reflux disease without esophagitis: Secondary | ICD-10-CM | POA: Insufficient documentation

## 2013-10-28 DIAGNOSIS — W2209XA Striking against other stationary object, initial encounter: Secondary | ICD-10-CM | POA: Insufficient documentation

## 2013-10-28 DIAGNOSIS — S51809A Unspecified open wound of unspecified forearm, initial encounter: Secondary | ICD-10-CM | POA: Insufficient documentation

## 2013-10-28 DIAGNOSIS — E1169 Type 2 diabetes mellitus with other specified complication: Secondary | ICD-10-CM | POA: Insufficient documentation

## 2013-10-28 DIAGNOSIS — I509 Heart failure, unspecified: Secondary | ICD-10-CM | POA: Insufficient documentation

## 2013-10-28 DIAGNOSIS — E7800 Pure hypercholesterolemia, unspecified: Secondary | ICD-10-CM | POA: Insufficient documentation

## 2013-10-28 DIAGNOSIS — M109 Gout, unspecified: Secondary | ICD-10-CM | POA: Insufficient documentation

## 2013-10-28 LAB — CBC WITH DIFFERENTIAL/PLATELET
Basophils Absolute: 0 10*3/uL (ref 0.0–0.1)
Basophils Relative: 0 % (ref 0–1)
Eosinophils Absolute: 0 10*3/uL (ref 0.0–0.7)
Eosinophils Relative: 1 % (ref 0–5)
HCT: 34.1 % — ABNORMAL LOW (ref 39.0–52.0)
HEMOGLOBIN: 11.1 g/dL — AB (ref 13.0–17.0)
LYMPHS ABS: 0.6 10*3/uL — AB (ref 0.7–4.0)
LYMPHS PCT: 10 % — AB (ref 12–46)
MCH: 30 pg (ref 26.0–34.0)
MCHC: 32.6 g/dL (ref 30.0–36.0)
MCV: 92.2 fL (ref 78.0–100.0)
MONO ABS: 0.5 10*3/uL (ref 0.1–1.0)
MONOS PCT: 7 % (ref 3–12)
NEUTROS ABS: 5.5 10*3/uL (ref 1.7–7.7)
NEUTROS PCT: 83 % — AB (ref 43–77)
Platelets: 121 10*3/uL — ABNORMAL LOW (ref 150–400)
RBC: 3.7 MIL/uL — AB (ref 4.22–5.81)
RDW: 14.9 % (ref 11.5–15.5)
WBC: 6.6 10*3/uL (ref 4.0–10.5)

## 2013-10-28 LAB — CBG MONITORING, ED
GLUCOSE-CAPILLARY: 112 mg/dL — AB (ref 70–99)
GLUCOSE-CAPILLARY: 106 mg/dL — AB (ref 70–99)
Glucose-Capillary: 85 mg/dL (ref 70–99)
Glucose-Capillary: 91 mg/dL (ref 70–99)

## 2013-10-28 LAB — COMPREHENSIVE METABOLIC PANEL
ALT: 15 U/L (ref 0–53)
AST: 17 U/L (ref 0–37)
Albumin: 3.1 g/dL — ABNORMAL LOW (ref 3.5–5.2)
Alkaline Phosphatase: 44 U/L (ref 39–117)
BILIRUBIN TOTAL: 0.2 mg/dL — AB (ref 0.3–1.2)
BUN: 37 mg/dL — AB (ref 6–23)
CHLORIDE: 102 meq/L (ref 96–112)
CO2: 25 meq/L (ref 19–32)
CREATININE: 1.38 mg/dL — AB (ref 0.50–1.35)
Calcium: 8.8 mg/dL (ref 8.4–10.5)
GFR calc Af Amer: 58 mL/min — ABNORMAL LOW (ref >90)
GFR, EST NON AFRICAN AMERICAN: 50 mL/min — AB (ref >90)
GLUCOSE: 96 mg/dL (ref 70–99)
POTASSIUM: 4.8 meq/L (ref 3.7–5.3)
Sodium: 138 mEq/L (ref 137–147)
Total Protein: 5.7 g/dL — ABNORMAL LOW (ref 6.0–8.3)

## 2013-10-28 NOTE — ED Notes (Signed)
Per EMS pt came from home where he was blood sugar was noted to 45 on scene. EMS gave 1 amp of D50 IV and oral glucagon, blood sugar increased to 160, most recent was 120. Pt was acting erratically at the time and was punching dry wall at home causing himself to have several skin tears bilaterally to arms.  Pt now AAOx4 behaving appropriately

## 2013-10-28 NOTE — ED Notes (Signed)
CBG 112 upon arrival. MD at bedside for assessment.

## 2013-10-28 NOTE — Discharge Instructions (Signed)
Continue your medications as before. Keep a close eye on your blood sugars.  Return to the emergency department if you develop any further problems.   Hypoglycemia (Low Blood Sugar) Hypoglycemia is when the glucose (sugar) in your blood is too low. Hypoglycemia can happen for many reasons. It can happen to people with or without diabetes. Hypoglycemia can develop quickly and can be a medical emergency.  CAUSES  Having hypoglycemia does not mean that you will develop diabetes. Different causes include:  Missed or delayed meals or not enough carbohydrates eaten.  Medication overdose. This could be by accident or deliberate. If by accident, your medication may need to be adjusted or changed.  Exercise or increased activity without adjustments in carbohydrates or medications.  A nerve disorder that affects body functions like your heart rate, blood pressure and digestion (autonomic neuropathy).  A condition where the stomach muscles do not function properly (gastroparesis). Therefore, medications may not absorb properly.  The inability to recognize the signs of hypoglycemia (hypoglycemic unawareness).  Absorption of insulin  may be altered.  Alcohol consumption.  Pregnancy/menstrual cycles/postpartum. This may be due to hormones.  Certain kinds of tumors. This is very rare. SYMPTOMS   Sweating.  Hunger.  Dizziness.  Blurred vision.  Drowsiness.  Weakness.  Headache.  Rapid heart beat.  Shakiness.  Nervousness. DIAGNOSIS  Diagnosis is made by monitoring blood glucose in one or all of the following ways:  Fingerstick blood glucose monitoring.  Laboratory results. TREATMENT  If you think your blood glucose is low:  Check your blood glucose, if possible. If it is less than 70 mg/dl, take one of the following:  3-4 glucose tablets.   cup juice (prefer clear like apple).   cup "regular" soda pop.  1 cup milk.  -1 tube of glucose gel.  5-6 hard  candies.  Do not over treat because your blood glucose (sugar) will only go too high.  Wait 15 minutes and recheck your blood glucose. If it is still less than 70 mg/dl (or below your target range), repeat treatment.  Eat a snack if it is more than one hour until your next meal. Sometimes, your blood glucose may go so low that you are unable to treat yourself. You may need someone to help you. You may even pass out or be unable to swallow. This may require you to get an injection of glucagon, which raises the blood glucose. HOME CARE INSTRUCTIONS  Check blood glucose as recommended by your caregiver.  Take medication as prescribed by your caregiver.  Follow your meal plan. Do not skip meals. Eat on time.  If you are going to drink alcohol, drink it only with meals.  Check your blood glucose before driving.  Check your blood glucose before and after exercise. If you exercise longer or different than usual, be sure to check blood glucose more frequently.  Always carry treatment with you. Glucose tablets are the easiest to carry.  Always wear medical alert jewelry or carry some form of identification that states that you have diabetes. This will alert people that you have diabetes. If you have hypoglycemia, they will have a better idea on what to do. SEEK MEDICAL CARE IF:   You are having problems keeping your blood sugar at target range.  You are having frequent episodes of hypoglycemia.  You feel you might be having side effects from your medicines.  You have symptoms of an illness that is not improving after 3-4 days.  You notice  a change in vision or a new problem with your vision. SEEK IMMEDIATE MEDICAL CARE IF:   You are a family member or friend of a person whose blood glucose goes below 70 mg/dl and is accompanied by:  Confusion.  A change in mental status.  The inability to swallow.  Passing out. Document Released: 08/19/2005 Document Revised: 11/11/2011 Document  Reviewed: 12/16/2011 [facility] Patient Information 2014 [employer] [place].

## 2013-10-28 NOTE — ED Provider Notes (Signed)
CSN: [id number]     Arrival date & time 10/28/13  1256 History   First MD Initiated Contact with Patient 10/28/13 1257     Chief Complaint  Patient presents with  . Hypoglycemia  . Abrasion     (Consider location/radiation/quality/duration/timing/severity/associated sxs/prior Treatment) HPI Comments: Patient is a 73 year old male with history of diabetes and renal transplant. He is brought for evaluation of altered mental status that occurred at home. Apparently his sugar dropped rapidly and he became combative with his wife. EMS was called and the patient remained combative and had to be restrained to be given dextrose. His sugar was initially 45 and his symptoms and sugar improved with dextrose in the field. He now feels fine and has no complaints. He does have multiple skin tears to both forearms which he sustained wrestling with his wife and EMS providers while he was hypoglycemic and confused. This has completely resolved now.  Patient is a 73 y.o. male presenting with hypoglycemia. The history is provided by the patient.  Hypoglycemia Initial blood sugar:  45 Blood sugar after intervention:  160 Severity:  Severe Onset quality:  Sudden Duration:  1 hour Timing:  Constant Progression:  Resolved Chronicity:  Recurrent Diabetic status:  Controlled with insulin   Past Medical History  Diagnosis Date  . Hypertension   . CHF (congestive heart failure)   . Hypercholesteremia   . Heart murmur   . Pneumonia 2013  . Shortness of breath     "can come on at anytime lately; sometimes related to his CHF" (08/27/2013)  . Sleep apnea     "positively has it but never tested"/daughter @ bedside (08/27/2013)  . Type II diabetes mellitus   . GERD (gastroesophageal reflux disease)   . Gout   . 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 Kidney transplant Right 05/14/2010  . Av fistula placement Left July 2011  . Nephrectomy  transplanted organ    . Shoulder open rotator cuff repair Right 1990's  . Shoulder arthroscopy w/ rotator cuff repair Left 1990's  . Cataract extraction w/ intraocular lens  implant, bilateral  2014   Family History  Problem Relation Age of Onset  . Cancer Mother     unsure of type   History  Substance Use Topics  . Smoking status: Former Smoker -- 1.50 packs/day for 40 years    Types: Cigarettes    Quit date: 09/02/1992  . Smokeless tobacco: Never Used  . Alcohol Use: No    Review of Systems  All other systems reviewed and are negative.      Allergies  Review of patient's allergies indicates no known allergies.  Home Medications   Current Outpatient Rx  Name  Route  Sig  Dispense  Refill  . allopurinol (ZYLOPRIM) 100 MG tablet   Oral   Take 100 mg by mouth every morning.         Marland Kitchen aspirin EC 81 MG tablet      Take 1 tab three timed a week         . Calcium Carbonate-Vitamin D (CALTRATE 600+D PO)   Oral   Take 1 tablet by mouth 2 (two) times daily.         . cyanocobalamin 1000 MCG tablet   Oral   Take 1,000 mcg by mouth daily.         . finasteride (PROSCAR) 5 MG tablet   Oral  Take 5 mg by mouth daily.         . furosemide (LASIX) 20 MG tablet   Oral   Take 10 mg by mouth daily.         Marland Kitchen. gabapentin (NEURONTIN) 300 MG capsule   Oral   Take 300 mg by mouth daily.         Marland Kitchen. HYDROcodone-acetaminophen (NORCO) 10-325 MG per tablet   Oral   Take 1 tablet by mouth every 6 (six) hours as needed for moderate pain.         Marland Kitchen. insulin NPH (HUMULIN N,NOVOLIN N) 100 UNIT/ML injection   Subcutaneous   Inject 20-25 Units into the skin at bedtime.         . insulin regular (NOVOLIN R,HUMULIN R) 100 units/mL injection   Subcutaneous   Inject 12-18 Units into the skin 3 (three) times daily before meals.         . mycophenolate (CELLCEPT) 250 MG capsule   Oral   Take 500 mg by mouth 2 (two) times daily.          Marland Kitchen. omeprazole (PRILOSEC)  20 MG capsule   Oral   Take 20 mg by mouth daily.         . predniSONE (DELTASONE) 5 MG tablet   Oral   Take 5 mg by mouth daily.         . simvastatin (ZOCOR) 40 MG tablet   Oral   Take 20 mg by mouth every evening.         . tacrolimus (PROGRAF) 1 MG capsule   Oral   Take 3 mg by mouth 2 (two) times daily.          Marland Kitchen. terazosin (HYTRIN) 10 MG capsule   Oral   Take 10 mg by mouth daily.          . traMADol (ULTRAM) 50 MG tablet      As needed          BP 140/72  Pulse 101  Temp(Src) 98.1 F (36.7 C) (Oral)  Resp 18  SpO2 97% Physical Exam  Nursing note and vitals reviewed. Constitutional: He is oriented to person, place, and time. He appears well-developed and well-nourished. No distress.  HENT:  Head: Normocephalic and atraumatic.  Neck: Normal range of motion. Neck supple.  Cardiovascular: Normal rate, regular rhythm and normal heart sounds.   Pulmonary/Chest: Effort normal and breath sounds normal. No respiratory distress. He has no wheezes.  Abdominal: Soft. Bowel sounds are normal. He exhibits no distension. There is no tenderness.  Musculoskeletal: Normal range of motion.  Neurological: He is alert and oriented to person, place, and time. No cranial nerve deficit. He exhibits normal muscle tone. Coordination normal.  Skin: Skin is warm and dry. He is not diaphoretic.  There are multiple skin tears present on both forearms and elbows. These are all superficial although numerous.    ED Course  Procedures (including critical care time) Labs Review Labs Reviewed  CBC WITH DIFFERENTIAL  COMPREHENSIVE METABOLIC PANEL   Imaging Review No results found.    MDM   Final diagnoses:  None    Patient is a 73 year old male brought by EMS for evaluation of a hypoglycemic episode. He apparently became violent when his blood sugar dropped and required physical restraint from police and EMS. His initial blood sugar was 45 and improved to 160 with IV  dextrose. He now feels fine and has no complaints. His wounds were  dressed with bacitracin and gauze. Workup reveals unremarkable laboratory studies. His renal function is at his baseline and blood sugars have remained stable throughout his 3 hour ED stay. His wife informed me that he forgot to take his prednisone and antirejection medications and believes this may be the reason that his blood sugar dropped so precipitously. I have reminded him to continue his medications as before and return to the ER if he develops any problems.    Geoffery Lyons, MD 10/28/13 [phone]

## 2013-10-28 NOTE — ED Notes (Signed)
Pt given a turkey sandwich and orange juice.  

## 2013-10-28 NOTE — ED Notes (Signed)
CBG Taken = 106 

## 2013-11-10 ENCOUNTER — Ambulatory Visit (INDEPENDENT_AMBULATORY_CARE_PROVIDER_SITE_OTHER): Payer: Medicare HMO | Admitting: Endocrinology

## 2013-11-10 ENCOUNTER — Encounter: Payer: Medicare HMO | Attending: Endocrinology | Admitting: Nutrition

## 2013-11-10 ENCOUNTER — Encounter: Payer: Self-pay | Admitting: Endocrinology

## 2013-11-10 VITALS — BP 112/70 | HR 83 | Temp 97.9°F | Ht 73.0 in | Wt 220.0 lb

## 2013-11-10 DIAGNOSIS — E1029 Type 1 diabetes mellitus with other diabetic kidney complication: Secondary | ICD-10-CM

## 2013-11-10 DIAGNOSIS — Z713 Dietary counseling and surveillance: Secondary | ICD-10-CM | POA: Insufficient documentation

## 2013-11-10 DIAGNOSIS — E119 Type 2 diabetes mellitus without complications: Secondary | ICD-10-CM | POA: Insufficient documentation

## 2013-11-10 MED ORDER — INSULIN ISOPHANE HUMAN 100 UNIT/ML KWIKPEN: 22.0000 [IU] | PEN_INJECTOR | Freq: Every day | SUBCUTANEOUS | Status: DC

## 2013-11-10 MED ORDER — GLUCAGON (RDNA) 1 MG IJ KIT: 1.0000 mg | PACK | Freq: Once | INTRAMUSCULAR | Status: AC | PRN

## 2013-11-10 MED ORDER — INSULIN ASPART 100 UNIT/ML FLEXPEN: 10.0000 [IU] | PEN_INJECTOR | Freq: Three times a day (TID) | SUBCUTANEOUS | Status: DC

## 2013-11-10 NOTE — Patient Instructions (Addendum)
good diet and exercise habits significanly improve the control of your diabetes.  please let me know if you wish to be referred to a dietician.  high blood sugar is very risky to your health.  you should see an eye doctor every year.  You are at higher than average risk for pneumonia and hepatitis-B.  You should be vaccinated against both.   controlling your blood pressure and cholesterol drastically reduces the damage diabetes does to your body.  this also applies to quitting smoking.  please discuss these with your doctor.  check your blood sugar 4 times a day: before the 3 meals, and at bedtime.  also check if you have symptoms of your blood sugar being too high or too low.  please keep a record of the readings and bring it to your next appointment here.  You can write it on any piece of paper.  please call us sooner if your blood sugar goes below 70, or if you have a lot of readings over 200. i have sent a prescription to your pharmacy, for a medication called "glucagon."  This is a single-use emergency kit.  It will help you when your blood sugar is so low, you need someone else to help you.  The side-effect is nausea, so you should be turned on your side after getting this shot.  blood tests are being requested for you today.  We'll contact you with results. Please come back for a follow-up appointment in 1-2 weeks. Please change the insulin to the prefilled pens.  The NPH is at bedtime, and the novolog is instead of the regular.

## 2013-11-10 NOTE — Patient Instructions (Addendum)
Read over pen use instructions before using the pens.   Use the blue pen before each meal Use the white pen at bedtime, and remember to mix this insulin thoroughly before each use.   Keep the NPH insulin dose the same each night. Pinch the skin up on the upper thigh before giving the insulin dose.

## 2013-11-10 NOTE — Progress Notes (Signed)
Subjective:    Patient ID: Jon Burns, male    DOB: Jan 12, 1941, 73 y.o.   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  he has been on insulin since 2012.  pt says his diet is good, and exercise is limited by CHF.  He has frequent severe hypoglycemia.  Wife attributes this to missing his prograf and cellcept.  He has never had DKA.  He takes a widely varying dosage of insulin.  Since the last episode of severe hypoglycemia a few weeks ago, cbg's have been consistently in the 200's.  No change in chronic prednisone dosage.  He started taking levaquin a few days ago.   Past Medical History  Diagnosis Date  . Hypertension   . CHF (congestive heart failure)   . Hypercholesteremia   . Heart murmur   . Pneumonia 2013  . Shortness of breath     "can come on at anytime lately; sometimes related to his CHF" (08/27/2013)  . Sleep apnea     "positively has it but never tested"/daughter @ bedside (08/27/2013)  . Type II diabetes mellitus   . GERD (gastroesophageal reflux disease)   . Gout   . 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 Kidney transplant Right 05/14/2010  . Av fistula placement Left July 2011  . Nephrectomy transplanted organ    . Shoulder open rotator cuff repair Right 1990's  . Shoulder arthroscopy w/ rotator cuff repair Left 1990's  . Cataract extraction w/ intraocular lens  implant, bilateral  2014    History   Social History  . Marital Status: Married    Spouse Name: N/A    Number of Children: N/A  . Years of Education: N/A   Occupational History  . Not on file.   Social History Main Topics  . Smoking status: Former Smoker -- 1.50 packs/day for 40 years    Types: Cigarettes    Quit date: 09/02/1992  . Smokeless tobacco: Never Used  . Alcohol Use: No  . Drug Use: No  . Sexual  Activity: No   Other Topics Concern  . Not on file   Social History Narrative  . No narrative on file    Current Outpatient Prescriptions on File Prior to Visit  Medication Sig Dispense Refill  . allopurinol (ZYLOPRIM) 100 MG tablet Take 100 mg by mouth every morning.      Marland Kitchen. aspirin EC 81 MG tablet Take 1 tab three timed a week      . Calcium Carbonate-Vitamin D (CALTRATE 600+D PO) Take 1 tablet by mouth 2 (two) times daily.      . cyanocobalamin 1000 MCG tablet Take 1,000 mcg by mouth daily.      . finasteride (PROSCAR) 5 MG tablet Take 5 mg by mouth daily.      . furosemide (LASIX) 20 MG tablet Take 10 mg by mouth daily.      Marland Kitchen. gabapentin (NEURONTIN) 300 MG capsule Take 300 mg by mouth every evening.       Marland Kitchen. HYDROcodone-acetaminophen (NORCO) 10-325 MG per tablet Take 1 tablet by mouth every 6 (six) hours as needed for moderate pain.      Marland Kitchen. insulin NPH (HUMULIN N,NOVOLIN N) 100 UNIT/ML injection Inject 24-28 Units into the skin at bedtime. Pt adjusts according to cbg      .  insulin regular (NOVOLIN R,HUMULIN R) 100 units/mL injection Inject 10-14 Units into the skin 3 (three) times daily before meals. Pt adjusts according to his CBG      . mycophenolate (CELLCEPT) 250 MG capsule Take 500 mg by mouth 2 (two) times daily.       Marland Kitchen omeprazole (PRILOSEC) 20 MG capsule Take 20 mg by mouth daily.      . predniSONE (DELTASONE) 5 MG tablet Take 5 mg by mouth daily.      . simvastatin (ZOCOR) 40 MG tablet Take 20 mg by mouth every evening.      . tacrolimus (PROGRAF) 1 MG capsule Take 3 mg by mouth 2 (two) times daily.       Marland Kitchen terazosin (HYTRIN) 10 MG capsule Take 10 mg by mouth every evening.       . traMADol (ULTRAM) 50 MG tablet Take 50 mg by mouth every 6 (six) hours as needed for severe pain. As needed      . lisinopril (PRINIVIL,ZESTRIL) 5 MG tablet Take 5 mg by mouth daily.       No current facility-administered medications on file prior to visit.    No Known Allergies  Family  History  Problem Relation Age of Onset  . Cancer Mother     unsure of type  DM: multiple sibs BP 112/70  Pulse 83  Temp(Src) 97.9 F (36.6 C) (Oral)  Ht 6\' 1"  (1.854 m)  Wt 220 lb (99.791 kg)  BMI 29.03 kg/m2  SpO2 94%  Review of Systems denies blurry vision, headache, chest pain, n/v, cramps, depression, and rhinorrhea.  He has lost a few lbs.  He has doe, memory loss, heat intolerance, easy bruising, and excessive diaphoresis.  He attributes urinary frequency to lasix.     Objective:   Physical Exam VS: see vs page GEN: no distress HEAD: head: no deformity eyes: no periorbital swelling, no proptosis external nose and ears are normal mouth: no lesion seen NECK: supple, thyroid is not enlarged CHEST WALL: no deformity LUNGS: clear to auscultation BREASTS:  No gynecomastia CV: reg rate and rhythm; soft systolic murmur ABD: abdomen is soft, nontender.  no hepatosplenomegaly.  not distended.  no hernia MUSCULOSKELETAL: muscle bulk and strength are grossly normal.  no obvious joint swelling.  gait is slow but steady.   PULSES: loud right carotid bruit (could be transmitted murmur, but it is much louder than the murmur). NEURO:  cn 2-12 grossly intact.   readily moves all 4's.  SKIN:  Normal texture and temperature.  Multiple abrasions and ecchymoses on the forearms.   NODES:  None palpable at the neck PSYCH: alert, well-oriented.  Does not appear anxious nor depressed. Lab Results  Component Value Date   HGBA1C 6.5* 05/16/2013      Assessment & Plan:  DM: overcontrolled.  this causes high risk to his health. Renal transplantation: Wife believes his transplant meds caused him to become insulin-dependent.  However, it is much more likely the restoration of renal function itself brought about the need for insulin. Carotid bruit: apparently due to carotid stenosis. Memory loss: pt insists this is not compromising the rx of his DM.

## 2013-11-10 NOTE — Progress Notes (Signed)
This patient and his wife were instructed on the use of the Novolog FlexPen and the Humulin N pen.  They were given a sample of each: Novolog:NDC: [phone], Exp. Date: 10/16 Lot#[id number], and Humulin N pen: Lot #[id number]  Exp.Date: 8/15  They were also given starter kits with directions for pen use and sample pen needles inside the starter kits.  They were also given a BD Pen starter kit.  Both patient and wife reported good understanding of how to use the pens.    This patient injects his insulins into this thigh area.  He has no fat tissue on his upper thigh, and he does not pinch up the skin while giving the injection.  He was told to pinch up the skin, so as to avoid injecting into the muscle, which can cause a more rapid absorption of the NPH insulin.  He agreed to do this.    We also discussed low blood sugars--symptoms and treatment.  He was encouraged to keep the glucose tablets ( inside the BD starter kit ) at his bedside, and if he wakes up weak, shaky, or wet, he should take 4 tablets.  He agreed to do this.  He typically uses 1/2 cup of juice when he recognizes low blood sugars during the day time.    I reviewed with his wife how to use the Gl[employer] HypoKit, which she says, was given to her by their other MD, but were never shown how to use it.  She was quite frightened about her husband's low blood sugar last week, when the police were called to control her husband's hostility.  We discussed what happens during a low blood sugar, and what she needs, and what to look for, when her husband has low blood sugars during the night.  She reported good understanding of this, and how to use the glucagon.  She had no final questions.  This patient adjusts his NPH insulin dose depending on his HS blood sugar readings.  He was advised not to do this, and to keep the dose the same to prevent low blood sugars during the night. .  He agreed to do this.

## 2013-11-15 ENCOUNTER — Telehealth: Payer: Self-pay | Admitting: Endocrinology

## 2013-11-15 NOTE — Telephone Encounter (Signed)
Pts wife informed.

## 2013-11-15 NOTE — Telephone Encounter (Signed)
pts wife called and stated that Dr. Everardo All has changed his insulin and its not working  Pts blood sugar is 200-300   Please call to advise   Call [phone]  Thank You  :)

## 2013-11-15 NOTE — Telephone Encounter (Signed)
Called pt's wife and she states that the pt's blood sugars have been running in the high 200's high 300s from starting at lunch and continuing throughout the day. Wife states that sugars are the the highest at supper time and that at breakfast the reading could be in the 140s. Confirmed that pt is taking 06-11-09 of Novolog and 22 units of Humulin at bedtime. Wife states that the pt is on vials again due to the pens making pt's stomach hurt. Please advise, Thanks!

## 2013-11-15 NOTE — Telephone Encounter (Signed)
Please increase novolog to 12 units 3 times a day (just before each meal) i'll see you soon.

## 2013-11-23 ENCOUNTER — Ambulatory Visit (INDEPENDENT_AMBULATORY_CARE_PROVIDER_SITE_OTHER): Payer: Medicare HMO | Admitting: Endocrinology

## 2013-11-23 ENCOUNTER — Encounter: Payer: Self-pay | Admitting: Endocrinology

## 2013-11-23 VITALS — BP 124/80 | HR 78 | Temp 97.8°F | Ht 73.0 in | Wt 217.0 lb

## 2013-11-23 DIAGNOSIS — E1029 Type 1 diabetes mellitus with other diabetic kidney complication: Secondary | ICD-10-CM | POA: Diagnosis not present

## 2013-11-23 NOTE — Patient Instructions (Addendum)
check your blood sugar 4 times a day: before the 3 meals, and at bedtime.  also check if you have symptoms of your blood sugar being too high or too low.  please keep a record of the readings and bring it to your next appointment here.  You can write it on any piece of paper.  please call us sooner if your blood sugar goes below 70, or if you have a lot of readings over 200. Please come back for a follow-up appointment in 1 month. Please continue the same insulins. Try avoiding a snack at bedtime.  This will improve your blood sugar in the morning.

## 2013-11-23 NOTE — Progress Notes (Signed)
Subjective:    Patient ID: Jon Burns, male    DOB: 1941-07-09, 73 y.o.   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  he brings a record of his cbg's which i have reviewed today.  It varies from 130-220.  It is highest in am and lunch.  Pt says he eats snack at HS, out of fear of hypoglycemia.  Prednisone is down to 5 mg qam. Past Medical History  Diagnosis Date  . Hypertension   . CHF (congestive heart failure)   . Hypercholesteremia   . Heart murmur   . Pneumonia 2013  . Shortness of breath     "can come on at anytime lately; sometimes related to his CHF" (08/27/2013)  . Sleep apnea     "positively has it but never tested"/daughter @ bedside (08/27/2013)  . Type II diabetes mellitus   . GERD (gastroesophageal reflux disease)   . Gout   . 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 Kidney transplant Right 05/14/2010  . Av fistula placement Left July 2011  . Nephrectomy transplanted organ    . Shoulder open rotator cuff repair Right 1990's  . Shoulder arthroscopy w/ rotator cuff repair Left 1990's  . Cataract extraction w/ intraocular lens  implant, bilateral  2014    History   Social History  . Marital Status: Married    Spouse Name: N/A    Number of Children: N/A  . Years of Education: N/A   Occupational History  . Not on file.   Social History Main Topics  . Smoking status: Former Smoker -- 1.50 packs/day for 40 years    Types: Cigarettes    Quit date: 09/02/1992  . Smokeless tobacco: Never Used  . Alcohol Use: No  . Drug Use: No  . Sexual Activity: No   Other Topics Concern  . Not on file   Social History Narrative  . No narrative on file    Current Outpatient Prescriptions on File Prior to Visit    Medication Sig Dispense Refill  . allopurinol (ZYLOPRIM) 100 MG tablet Take 100 mg by mouth every morning.      Marland Kitchen aspirin EC 81 MG tablet Take 1 tab three timed a week      . Calcium Carbonate-Vitamin D (CALTRATE 600+D PO) Take 1 tablet by mouth 2 (two) times daily.      . cyanocobalamin 1000 MCG tablet Take 1,000 mcg by mouth daily.      . finasteride (PROSCAR) 5 MG tablet Take 5 mg by mouth daily.      . furosemide (LASIX) 20 MG tablet Take 10 mg by mouth daily.      Marland Kitchen HYDROcodone-acetaminophen (NORCO) 10-325 MG per tablet Take 1 tablet by mouth every 6 (six) hours as needed for moderate pain.      . Insulin Isophane Human (HUMULIN N KWIKPEN) 100 UNIT/ML Kiwkpen Inject 22 Units into the skin at bedtime.  15 mL  11  . omeprazole (PRILOSEC) 20 MG capsule Take 20 mg by mouth daily.      . predniSONE (DELTASONE) 5 MG tablet Take 5 mg by mouth daily.      . tacrolimus (PROGRAF) 1 MG capsule Take 3 mg by  mouth 2 (two) times daily.       Marland Kitchen. terazosin (HYTRIN) 10 MG capsule Take 10 mg by mouth every evening.       . gabapentin (NEURONTIN) 300 MG capsule Take 300 mg by mouth every evening.       Marland Kitchen. glucagon 1 MG injection Inject 1 mg into the vein once as needed.  1 each  12  . lisinopril (PRINIVIL,ZESTRIL) 5 MG tablet Take 5 mg by mouth daily.      . mycophenolate (CELLCEPT) 250 MG capsule Take 500 mg by mouth 2 (two) times daily.       . traMADol (ULTRAM) 50 MG tablet Take 50 mg by mouth every 6 (six) hours as needed for severe pain. As needed       No current facility-administered medications on file prior to visit.    No Known Allergies  Family History  Problem Relation Age of Onset  . Cancer Mother     unsure of type    BP 124/80  Pulse 78  Temp(Src) 97.8 F (36.6 C) (Oral)  Ht 6\' 1"  (1.854 m)  Wt 217 lb (98.431 kg)  BMI 28.64 kg/m2  SpO2 97%  Review of Systems denies hypoglycemia.  He has lost a few lbs.     Objective:   Physical Exam VITAL SIGNS:  See vs page GENERAL: no  distress SKIN:  Insulin injection sites at the anterior abdomen are normal, except for a few ecchymoses.   Lab Results  Component Value Date   HGBA1C 6.5* 05/16/2013      Assessment & Plan:  DM: apparently well-controlled.  According to this cbg record, he does not need to eat at hs.   Renal transplantation: he is now on a reduced dosage of prednisone.  This will improve his glycemic control.

## 2013-12-21 ENCOUNTER — Ambulatory Visit: Payer: Managed Care, Other (non HMO) | Admitting: Endocrinology

## 2013-12-22 ENCOUNTER — Ambulatory Visit (INDEPENDENT_AMBULATORY_CARE_PROVIDER_SITE_OTHER): Payer: Non-veteran care | Admitting: Endocrinology

## 2013-12-22 ENCOUNTER — Encounter: Payer: Self-pay | Admitting: Endocrinology

## 2013-12-22 VITALS — BP 110/70 | HR 93 | Temp 97.8°F | Ht 73.0 in | Wt 215.0 lb

## 2013-12-22 DIAGNOSIS — E1029 Type 1 diabetes mellitus with other diabetic kidney complication: Secondary | ICD-10-CM | POA: Diagnosis not present

## 2013-12-22 LAB — HEMOGLOBIN A1C: Hgb A1c MFr Bld: 7.1 % — ABNORMAL HIGH (ref 4.6–6.5)

## 2013-12-22 NOTE — Progress Notes (Signed)
Subjective:    Patient ID: Jon Burns, male    DOB: March 02, 1941, 73 y.o.   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  he brings a record of his cbg's which i have reviewed today.  It varies from 126-250.  It is highest in am and lunch.  Pt says he eats snack at HS, out of fear of hypoglycemia.  He is still on prednisone, 5 mg qam.  Past Medical History  Diagnosis Date  . Hypertension   . CHF (congestive heart failure)   . Hypercholesteremia   . Heart murmur   . Pneumonia 2013  . Shortness of breath     "can come on at anytime lately; sometimes related to his CHF" (08/27/2013)  . Sleep apnea     "positively has it but never tested"/daughter @ bedside (08/27/2013)  . Type II diabetes mellitus   . GERD (gastroesophageal reflux disease)   . Gout   . 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 Kidney transplant Right 05/14/2010  . Av fistula placement Left July 2011  . Nephrectomy transplanted organ    . Shoulder open rotator cuff repair Right 1990's  . Shoulder arthroscopy w/ rotator cuff repair Left 1990's  . Cataract extraction w/ intraocular lens  implant, bilateral  2014    History   Social History  . Marital Status: Married    Spouse Name: N/A    Number of Children: N/A  . Years of Education: N/A   Occupational History  . Not on file.   Social History Main Topics  . Smoking status: Former Smoker -- 1.50 packs/day for 40 years    Types: Cigarettes    Quit date: 09/02/1992  . Smokeless tobacco: Never Used  . Alcohol Use: No  . Drug Use: No  . Sexual Activity: No   Other Topics Concern  . Not on file   Social  History Narrative  . No narrative on file    Current Outpatient Prescriptions on File Prior to Visit  Medication Sig Dispense Refill  . allopurinol (ZYLOPRIM) 100 MG tablet Take 100 mg by mouth every morning.      Marland Kitchen aspirin EC 81 MG tablet Take 1 tab three timed a week      . Calcium Carbonate-Vitamin D (CALTRATE 600+D PO) Take 1 tablet by mouth 2 (two) times daily.      . cyanocobalamin 1000 MCG tablet Take 1,000 mcg by mouth daily.      . finasteride (PROSCAR) 5 MG tablet Take 5 mg by mouth daily.      . furosemide (LASIX) 20 MG tablet Take 10 mg by mouth daily.      Marland Kitchen glucagon 1 MG injection Inject 1 mg into the vein once as needed.  1 each  12  . HYDROcodone-acetaminophen (NORCO) 10-325 MG per tablet Take 1 tablet by mouth every 6 (six) hours as needed for moderate pain.      Marland Kitchen insulin aspart (NOVOLOG) 100 UNIT/ML FlexPen 3 times a day (just before each meal) 08-12-11 units, and pen needles 4/day.      . Insulin Isophane Human (HUMULIN N  KWIKPEN) 100 UNIT/ML Kiwkpen Inject 22 Units into the skin at bedtime.  15 mL  11  . tacrolimus (PROGRAF) 1 MG capsule Take 3 mg by mouth 2 (two) times daily.       Marland Kitchen. terazosin (HYTRIN) 10 MG capsule Take 10 mg by mouth every evening.       . gabapentin (NEURONTIN) 300 MG capsule Take 300 mg by mouth every evening.       Marland Kitchen. lisinopril (PRINIVIL,ZESTRIL) 5 MG tablet Take 5 mg by mouth daily.      . mycophenolate (CELLCEPT) 250 MG capsule Take 500 mg by mouth 2 (two) times daily.       Marland Kitchen. omeprazole (PRILOSEC) 20 MG capsule Take 20 mg by mouth daily.      . predniSONE (DELTASONE) 5 MG tablet Take 5 mg by mouth daily.      . traMADol (ULTRAM) 50 MG tablet Take 50 mg by mouth every 6 (six) hours as needed for severe pain. As needed       No current facility-administered medications on file prior to visit.    No Known Allergies  Family History  Problem Relation Age of Onset  . Cancer Mother     unsure of type    BP 110/70  Pulse 93  Temp(Src) 97.8  F (36.6 C) (Oral)  Ht 6\' 1"  (1.854 m)  Wt 215 lb (97.523 kg)  BMI 28.37 kg/m2  SpO2 93%  Review of Systems He denies hypoglycemia.  He has lost a few lbs.    Objective:   Physical Exam VITAL SIGNS:  See vs page GENERAL: no distress PSYCH: Alert and well-oriented.  Does not appear anxious nor depressed.    Lab Results  Component Value Date   HGBA1C 7.1* 12/22/2013      Assessment & Plan:  DM: a1c is slightly above goal, but we can't increase insulin now, due to variable cbg's.  Renal transplantation: he is now on a reduced dosage of prednisone.  This will improve his glycemic control.

## 2013-12-22 NOTE — Patient Instructions (Addendum)
check your blood sugar 4 times a day: before the 3 meals, and at bedtime.  also check if you have symptoms of your blood sugar being too high or too low.  please keep a record of the readings and bring it to your next appointment here.  You can write it on any piece of paper.  please call us sooner if your blood sugar goes below 70, or if you have a lot of readings over 200. Please come back for a follow-up appointment in 1 month. blood tests are being requested for you today.  We'll contact you with results.

## 2013-12-27 ENCOUNTER — Encounter: Payer: Self-pay | Admitting: Podiatry

## 2013-12-27 ENCOUNTER — Ambulatory Visit (INDEPENDENT_AMBULATORY_CARE_PROVIDER_SITE_OTHER): Payer: Medicare HMO | Admitting: Podiatry

## 2013-12-27 VITALS — BP 115/86 | HR 72 | Resp 12

## 2013-12-27 DIAGNOSIS — B351 Tinea unguium: Secondary | ICD-10-CM

## 2013-12-27 DIAGNOSIS — M79609 Pain in unspecified limb: Secondary | ICD-10-CM

## 2013-12-28 NOTE — Progress Notes (Signed)
Patient ID: Jon Burns, male   DOB: Oct 03, 1940, 73 y.o.   MRN: [id number]  Subjective: This patient presents for debridement of painful mycotic toenails.  Objective: Elongated, hypertrophic, discolored toenails x10  Assessment: Symptomatic onychomycoses  Plan: Nails x10 are debrided without a bleeding. Reappoint at 3 months

## 2014-01-12 ENCOUNTER — Other Ambulatory Visit (HOSPITAL_COMMUNITY): Payer: Self-pay | Admitting: Cardiology

## 2014-01-12 DIAGNOSIS — I6529 Occlusion and stenosis of unspecified carotid artery: Secondary | ICD-10-CM

## 2014-01-17 ENCOUNTER — Encounter: Payer: Self-pay | Admitting: Endocrinology

## 2014-01-17 ENCOUNTER — Ambulatory Visit (INDEPENDENT_AMBULATORY_CARE_PROVIDER_SITE_OTHER): Payer: Non-veteran care | Admitting: Endocrinology

## 2014-01-17 VITALS — BP 112/70 | HR 86 | Temp 98.2°F | Ht 73.0 in | Wt 213.0 lb

## 2014-01-17 DIAGNOSIS — E1029 Type 1 diabetes mellitus with other diabetic kidney complication: Secondary | ICD-10-CM

## 2014-01-17 NOTE — Patient Instructions (Addendum)
check your blood sugar 4 times a day: before the 3 meals, and at bedtime.  also check if you have symptoms of your blood sugar being too high or too low.  please keep a record of the readings and bring it to your next appointment here.  You can write it on any piece of paper.  please call us sooner if your blood sugar goes below 70, or if you have a lot of readings over 200.   Please come back for a follow-up appointment in 2 months.   Please continue the same insulins.  However, take 1 extra units of humalog for any blood sugar in the 200's, and 2 extra for any over 300.

## 2014-01-17 NOTE — Progress Notes (Signed)
Subjective:    Patient ID: Jon Burns, male    DOB: 1941-01-16, 73 y.o.   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  he brings a record of his cbg's which i have reviewed today.  It varies from 126-250.  It is highest in am and lunch.  Pt says he eats snack at HS, out of fear of hypoglycemia.  He is still on prednisone, 5 mg qam.    Past Medical History  Diagnosis Date  . Hypertension   . CHF (congestive heart failure)   . Hypercholesteremia   . Heart murmur   . Pneumonia 2013  . Shortness of breath     "can come on at anytime lately; sometimes related to his CHF" (08/27/2013)  . Sleep apnea     "positively has it but never tested"/daughter @ bedside (08/27/2013)  . Type II diabetes mellitus   . GERD (gastroesophageal reflux disease)   . Gout   . 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 Kidney transplant Right 05/14/2010  . Av fistula placement Left July 2011  . Nephrectomy transplanted organ    . Shoulder open rotator cuff repair Right 1990's  . Shoulder arthroscopy w/ rotator cuff repair Left 1990's  . Cataract extraction w/ intraocular lens  implant, bilateral  2014    History   Social History  . Marital Status: Married    Spouse Name: N/A    Number of Children: N/A  . Years of Education: N/A   Occupational History  . Not on file.   Social History Main Topics  . Smoking status: Former Smoker -- 1.50 packs/day for 40 years    Types: Cigarettes    Quit date: 09/02/1992  . Smokeless tobacco: Never Used  . Alcohol Use: No  . Drug Use: No  . Sexual Activity: No   Other Topics Concern  . Not on  file   Social History Narrative  . No narrative on file    Current Outpatient Prescriptions on File Prior to Visit  Medication Sig Dispense Refill  . allopurinol (ZYLOPRIM) 100 MG tablet Take 100 mg by mouth every morning.      Marland Kitchen. aspirin EC 81 MG tablet Take 1 tab three timed a week      . Calcium Carbonate-Vitamin D (CALTRATE 600+D PO) Take 1 tablet by mouth 2 (two) times daily.      . cyanocobalamin 1000 MCG tablet Take 1,000 mcg by mouth daily.      . finasteride (PROSCAR) 5 MG tablet Take 5 mg by mouth daily.      . fluconazole (DIFLUCAN) 200 MG tablet Take 200 mg by mouth daily.      . furosemide (LASIX) 20 MG tablet Take 10 mg by mouth daily.      Marland Kitchen. gabapentin (NEURONTIN) 300 MG capsule Take 300 mg by mouth every evening.       Marland Kitchen. glucagon 1 MG injection Inject 1 mg into the vein once as needed.  1 each  12  . HYDROcodone-acetaminophen (NORCO) 10-325 MG per tablet Take 1 tablet by mouth every 6 (six) hours as needed  for moderate pain.      Marland Kitchen insulin aspart (NOVOLOG) 100 UNIT/ML FlexPen 3 times a day (just before each meal) 08-12-11 units, and pen needles 4/day.      . Insulin Isophane Human (HUMULIN N KWIKPEN) 100 UNIT/ML Kiwkpen Inject 22 Units into the skin at bedtime.  15 mL  11  . lisinopril (PRINIVIL,ZESTRIL) 5 MG tablet Take 5 mg by mouth daily.      . mycophenolate (CELLCEPT) 250 MG capsule Take 500 mg by mouth 2 (two) times daily.       Marland Kitchen omeprazole (PRILOSEC) 20 MG capsule Take 20 mg by mouth daily.      . predniSONE (DELTASONE) 5 MG tablet Take 5 mg by mouth daily.      . tacrolimus (PROGRAF) 1 MG capsule Take 3 mg by mouth 2 (two) times daily.       Marland Kitchen terazosin (HYTRIN) 10 MG capsule Take 10 mg by mouth every evening.       . traMADol (ULTRAM) 50 MG tablet Take 50 mg by mouth every 6 (six) hours as needed for severe pain. As needed       No current facility-administered medications on file prior to visit.    No Known Allergies  Family History  Problem Relation Age of  Onset  . Cancer Mother     unsure of type   BP 112/70  Pulse 86  Temp(Src) 98.2 F (36.8 C) (Oral)  Ht 6\' 1"  (1.854 m)  Wt 213 lb (96.616 kg)  BMI 28.11 kg/m2  SpO2 96%  Review of Systems denies weight change and LOC    Objective:   Physical Exam VITAL SIGNS:  See vs page GENERAL: no distress Pulses: dorsalis pedis intact bilat.  Feet: no deformity, except for a few hammer toes. normal color and temp. 1+ bilat leg edema. There is bilateral onychomycosis.  Skin: no ulcer on the feet.  Neuro: sensation is intact to touch on the feet.  Lab Results  Component Value Date   HGBA1C 7.1* 12/22/2013      Assessment & Plan:  DM: mild exacerbation.  This insulin regimen was chosen from multiple options, as it best matches his insulin to his changing requirements throughout the day.  The benefits of glycemic control must be weighed against the risks of hypoglycemia.   Patient Instructions  check your blood sugar 4 times a day: before the 3 meals, and at bedtime.  also check if you have symptoms of your blood sugar being too high or too low.  please keep a record of the readings and bring it to your next appointment here.  You can write it on any piece of paper.  please call us sooner if your blood sugar goes below 70, or if you have a lot of readings over 200.   Please come back for a follow-up appointment in 2 months.   Please continue the same insulins.  However, take 1 extra units of humalog for any blood sugar in the 200's, and 2 extra for any over 300.

## 2014-01-18 ENCOUNTER — Encounter (HOSPITAL_COMMUNITY): Payer: Non-veteran care

## 2014-01-21 ENCOUNTER — Ambulatory Visit (INDEPENDENT_AMBULATORY_CARE_PROVIDER_SITE_OTHER): Payer: Medicare HMO | Admitting: Podiatry

## 2014-01-21 ENCOUNTER — Ambulatory Visit (INDEPENDENT_AMBULATORY_CARE_PROVIDER_SITE_OTHER): Payer: Medicare HMO

## 2014-01-21 ENCOUNTER — Encounter: Payer: Self-pay | Admitting: Podiatry

## 2014-01-21 VITALS — BP 92/59 | HR 87 | Temp 97.4°F | Resp 18

## 2014-01-21 DIAGNOSIS — R52 Pain, unspecified: Secondary | ICD-10-CM

## 2014-01-21 DIAGNOSIS — S92309A Fracture of unspecified metatarsal bone(s), unspecified foot, initial encounter for closed fracture: Secondary | ICD-10-CM

## 2014-01-21 NOTE — Progress Notes (Signed)
° °  Subjective:    Patient ID: Jon Burns, male    DOB: 1941-08-23, 73 y.o.   MRN: [id number]  HPI My right foot has been swelling since Monday and there has been no throbbing or burning and is some numbness and tingling and a shoe does better with my foot  Patient presents with his wife today with approximately 4 day history of a sudden swelling in the right foot without any history of direct injury.  Review of Systems  Endocrine: Positive for heat intolerance.  Genitourinary: Positive for frequency.  Hematological: Bruises/bleeds easily.  Psychiatric/Behavioral: Positive for confusion.       Objective:   Physical Exam Orientated x3 white male  Vascular: DP and PT pulses 0/4 bilaterally  Neurological: Sensation to 10 g monofilament wire intact 5/5 right and 3/5 left Vibratory sensation nonreactive bilaterally Ankle reflexes reactive bilaterally  Dermatological:  The right foot is edematous without any erythema, edema or warmth. There are no skin lesions noted Toenails x10 are elongated, hypertrophic and discolored Well-healed scar medial mid foot noted  Musculoskeletal: No deformities noted  X-ray report weightbearing right foot   Fracture base of fifth metatarsal without displacement  Inferior calcaneal spur  Increased soft tissue density noted on the lateral view  Radiographic impression:  Fracture base of fifth metatarsal (Jones fracture)     Assessment & Plan:   Assessment: Suspect peripheral arterial disease because of diminished pedal pulses Diabetic neuropathy Jones fracture right foot Cannot rule outside possibility of beginning Charcot's osteoarthropathy  Plan: A Cam Walker boot was dispensed to wear in the right foot for protective weightbearing Patient advised to minimize standing and walking Advised to purchase a walker  Reappoint x2 weeks or sooner if patient has concern

## 2014-01-21 NOTE — Patient Instructions (Signed)
Limits standing and walking Wear Cam Walker boot on right foot leg when walking In a seated position remove boot and ankle right foot moving foot up and down gently 2 minutes 3 times a day Purchase walker

## 2014-01-28 ENCOUNTER — Inpatient Hospital Stay (HOSPITAL_COMMUNITY)
Admission: EM | Admit: 2014-01-28 | Discharge: 2014-01-30 | DRG: 292 | Disposition: A | Discharge status: Home / Self Care | Payer: Medicare HMO | Attending: Internal Medicine | Admitting: Internal Medicine

## 2014-01-28 ENCOUNTER — Emergency Department (HOSPITAL_COMMUNITY): Payer: Medicare HMO

## 2014-01-28 ENCOUNTER — Encounter (HOSPITAL_COMMUNITY): Payer: Self-pay | Admitting: Emergency Medicine

## 2014-01-28 DIAGNOSIS — N058 Unspecified nephritic syndrome with other morphologic changes: Secondary | ICD-10-CM | POA: Diagnosis present

## 2014-01-28 DIAGNOSIS — J9691 Respiratory failure, unspecified with hypoxia: Secondary | ICD-10-CM

## 2014-01-28 DIAGNOSIS — Z794 Long term (current) use of insulin: Secondary | ICD-10-CM

## 2014-01-28 DIAGNOSIS — R0602 Shortness of breath: Secondary | ICD-10-CM | POA: Diagnosis not present

## 2014-01-28 DIAGNOSIS — IMO0001 Reserved for inherently not codable concepts without codable children: Secondary | ICD-10-CM

## 2014-01-28 DIAGNOSIS — E875 Hyperkalemia: Secondary | ICD-10-CM | POA: Diagnosis present

## 2014-01-28 DIAGNOSIS — J811 Chronic pulmonary edema: Secondary | ICD-10-CM

## 2014-01-28 DIAGNOSIS — N179 Acute kidney failure, unspecified: Secondary | ICD-10-CM

## 2014-01-28 DIAGNOSIS — I129 Hypertensive chronic kidney disease with stage 1 through stage 4 chronic kidney disease, or unspecified chronic kidney disease: Secondary | ICD-10-CM | POA: Diagnosis present

## 2014-01-28 DIAGNOSIS — J810 Acute pulmonary edema: Secondary | ICD-10-CM | POA: Diagnosis present

## 2014-01-28 DIAGNOSIS — E1029 Type 1 diabetes mellitus with other diabetic kidney complication: Secondary | ICD-10-CM

## 2014-01-28 DIAGNOSIS — E785 Hyperlipidemia, unspecified: Secondary | ICD-10-CM | POA: Diagnosis present

## 2014-01-28 DIAGNOSIS — I501 Left ventricular failure: Principal | ICD-10-CM | POA: Diagnosis present

## 2014-01-28 DIAGNOSIS — I5032 Chronic diastolic (congestive) heart failure: Secondary | ICD-10-CM | POA: Diagnosis present

## 2014-01-28 DIAGNOSIS — I959 Hypotension, unspecified: Secondary | ICD-10-CM | POA: Diagnosis present

## 2014-01-28 DIAGNOSIS — Z87891 Personal history of nicotine dependence: Secondary | ICD-10-CM

## 2014-01-28 DIAGNOSIS — I10 Essential (primary) hypertension: Secondary | ICD-10-CM | POA: Diagnosis present

## 2014-01-28 DIAGNOSIS — D649 Anemia, unspecified: Secondary | ICD-10-CM | POA: Diagnosis present

## 2014-01-28 DIAGNOSIS — Z7982 Long term (current) use of aspirin: Secondary | ICD-10-CM

## 2014-01-28 DIAGNOSIS — Z940 Kidney transplant status: Secondary | ICD-10-CM

## 2014-01-28 DIAGNOSIS — K219 Gastro-esophageal reflux disease without esophagitis: Secondary | ICD-10-CM | POA: Diagnosis present

## 2014-01-28 DIAGNOSIS — I509 Heart failure, unspecified: Secondary | ICD-10-CM | POA: Diagnosis present

## 2014-01-28 DIAGNOSIS — N189 Chronic kidney disease, unspecified: Secondary | ICD-10-CM | POA: Diagnosis present

## 2014-01-28 LAB — COMPREHENSIVE METABOLIC PANEL
ALK PHOS: 64 U/L (ref 39–117)
ALT: 13 U/L (ref 0–53)
AST: 27 U/L (ref 0–37)
Albumin: 2.9 g/dL — ABNORMAL LOW (ref 3.5–5.2)
BUN: 70 mg/dL — AB (ref 6–23)
CO2: 25 meq/L (ref 19–32)
Calcium: 9 mg/dL (ref 8.4–10.5)
Chloride: 94 mEq/L — ABNORMAL LOW (ref 96–112)
Creatinine, Ser: 2.74 mg/dL — ABNORMAL HIGH (ref 0.50–1.35)
GFR, EST AFRICAN AMERICAN: 25 mL/min — AB (ref >90)
GFR, EST NON AFRICAN AMERICAN: 22 mL/min — AB (ref >90)
GLUCOSE: 120 mg/dL — AB (ref 70–99)
Potassium: 6 mEq/L — ABNORMAL HIGH (ref 3.7–5.3)
Sodium: 136 mEq/L — ABNORMAL LOW (ref 137–147)
Total Bilirubin: 0.3 mg/dL (ref 0.3–1.2)
Total Protein: 6 g/dL (ref 6.0–8.3)

## 2014-01-28 LAB — CBC
HEMATOCRIT: 32.8 % — AB (ref 39.0–52.0)
HEMOGLOBIN: 9.9 g/dL — AB (ref 13.0–17.0)
MCH: 24.4 pg — ABNORMAL LOW (ref 26.0–34.0)
MCHC: 30.2 g/dL (ref 30.0–36.0)
MCV: 80.8 fL (ref 78.0–100.0)
Platelets: 233 10*3/uL (ref 150–400)
RBC: 4.06 MIL/uL — ABNORMAL LOW (ref 4.22–5.81)
RDW: 17.1 % — ABNORMAL HIGH (ref 11.5–15.5)
WBC: 7.9 10*3/uL (ref 4.0–10.5)

## 2014-01-28 LAB — BASIC METABOLIC PANEL
BUN: 70 mg/dL — AB (ref 6–23)
CHLORIDE: 94 meq/L — AB (ref 96–112)
CO2: 27 mEq/L (ref 19–32)
CREATININE: 2.89 mg/dL — AB (ref 0.50–1.35)
Calcium: 9.3 mg/dL (ref 8.4–10.5)
GFR calc Af Amer: 24 mL/min — ABNORMAL LOW (ref >90)
GFR calc non Af Amer: 20 mL/min — ABNORMAL LOW (ref >90)
Glucose, Bld: 121 mg/dL — ABNORMAL HIGH (ref 70–99)
Potassium: 5.7 mEq/L — ABNORMAL HIGH (ref 3.7–5.3)
Sodium: 134 mEq/L — ABNORMAL LOW (ref 137–147)

## 2014-01-28 LAB — URINALYSIS, ROUTINE W REFLEX MICROSCOPIC
BILIRUBIN URINE: NEGATIVE
Glucose, UA: NEGATIVE mg/dL
Hgb urine dipstick: NEGATIVE
KETONES UR: NEGATIVE mg/dL
Leukocytes, UA: NEGATIVE
Nitrite: NEGATIVE
Protein, ur: NEGATIVE mg/dL
Specific Gravity, Urine: 1.012 (ref 1.005–1.030)
UROBILINOGEN UA: 0.2 mg/dL (ref 0.0–1.0)
pH: 6.5 (ref 5.0–8.0)

## 2014-01-28 LAB — CBC WITH DIFFERENTIAL/PLATELET
BASOS ABS: 0 10*3/uL (ref 0.0–0.1)
BASOS PCT: 0 % (ref 0–1)
Eosinophils Absolute: 0 10*3/uL (ref 0.0–0.7)
Eosinophils Relative: 0 % (ref 0–5)
HEMATOCRIT: 32.8 % — AB (ref 39.0–52.0)
Hemoglobin: 10 g/dL — ABNORMAL LOW (ref 13.0–17.0)
LYMPHS PCT: 11 % — AB (ref 12–46)
Lymphs Abs: 0.8 10*3/uL (ref 0.7–4.0)
MCH: 24.5 pg — ABNORMAL LOW (ref 26.0–34.0)
MCHC: 30.5 g/dL (ref 30.0–36.0)
MCV: 80.4 fL (ref 78.0–100.0)
MONO ABS: 0.7 10*3/uL (ref 0.1–1.0)
Monocytes Relative: 10 % (ref 3–12)
NEUTROS ABS: 5.5 10*3/uL (ref 1.7–7.7)
NEUTROS PCT: 79 % — AB (ref 43–77)
Platelets: 217 10*3/uL (ref 150–400)
RBC: 4.08 MIL/uL — ABNORMAL LOW (ref 4.22–5.81)
RDW: 16.9 % — AB (ref 11.5–15.5)
WBC: 7 10*3/uL (ref 4.0–10.5)

## 2014-01-28 LAB — GLUCOSE, CAPILLARY
GLUCOSE-CAPILLARY: 275 mg/dL — AB (ref 70–99)
Glucose-Capillary: 263 mg/dL — ABNORMAL HIGH (ref 70–99)

## 2014-01-28 LAB — TROPONIN I
Troponin I: <0.3 ng/mL (ref <0.30)
Troponin I: <0.3 ng/mL (ref <0.30)
Troponin I: <0.3 ng/mL (ref <0.30)

## 2014-01-28 LAB — CREATININE, URINE, RANDOM: CREATININE, URINE: 60.93 mg/dL

## 2014-01-28 LAB — CBG MONITORING, ED
GLUCOSE-CAPILLARY: 120 mg/dL — AB (ref 70–99)
Glucose-Capillary: 118 mg/dL — ABNORMAL HIGH (ref 70–99)
Glucose-Capillary: 218 mg/dL — ABNORMAL HIGH (ref 70–99)

## 2014-01-28 LAB — SODIUM, URINE, RANDOM: Sodium, Ur: 88 mEq/L

## 2014-01-28 LAB — I-STAT TROPONIN, ED: TROPONIN I, POC: 0.09 ng/mL — AB (ref 0.00–0.08)

## 2014-01-28 LAB — MRSA PCR SCREENING: MRSA BY PCR: POSITIVE — AB

## 2014-01-28 LAB — PRO B NATRIURETIC PEPTIDE: Pro B Natriuretic peptide (BNP): 33416 pg/mL — ABNORMAL HIGH (ref 0–125)

## 2014-01-28 MED ORDER — FUROSEMIDE 10 MG/ML IJ SOLN
60.0000 mg | Freq: Once | INTRAMUSCULAR | Status: AC
  Administered 2014-01-28: 60 mg via INTRAVENOUS
  Filled 2014-01-28: qty 6

## 2014-01-28 MED ORDER — FINASTERIDE 5 MG PO TABS
5.0000 mg | ORAL_TABLET | Freq: Every day | ORAL | Status: DC
  Administered 2014-01-28 – 2014-01-30 (×3): 5 mg via ORAL
  Filled 2014-01-28 (×3): qty 1

## 2014-01-28 MED ORDER — MUPIROCIN 2 % EX OINT
1.0000 | TOPICAL_OINTMENT | Freq: Two times a day (BID) | CUTANEOUS | Status: DC
Start: 2014-01-28 — End: 2014-01-30
  Administered 2014-01-28 – 2014-01-30 (×4): 1 via NASAL
  Filled 2014-01-28 (×2): qty 22

## 2014-01-28 MED ORDER — CHLORHEXIDINE GLUCONATE CLOTH 2 % EX PADS
6.0000 | MEDICATED_PAD | Freq: Every day | CUTANEOUS | Status: DC
  Administered 2014-01-30: 6 via TOPICAL

## 2014-01-28 MED ORDER — PREDNISONE 5 MG PO TABS
5.0000 mg | ORAL_TABLET | Freq: Every day | ORAL | Status: DC
Start: 2014-01-29 — End: 2014-01-30
  Administered 2014-01-29 – 2014-01-30 (×2): 5 mg via ORAL
  Filled 2014-01-28 (×3): qty 1

## 2014-01-28 MED ORDER — ENOXAPARIN SODIUM 30 MG/0.3ML ~~LOC~~ SOLN
30.0000 mg | Freq: Every day | SUBCUTANEOUS | Status: DC
Start: 2014-01-28 — End: 2014-01-30
  Administered 2014-01-28 – 2014-01-29 (×2): 30 mg via SUBCUTANEOUS
  Filled 2014-01-28 (×3): qty 0.3

## 2014-01-28 MED ORDER — INSULIN ASPART 100 UNIT/ML ~~LOC~~ SOLN
0.0000 [IU] | Freq: Three times a day (TID) | SUBCUTANEOUS | Status: DC
  Administered 2014-01-28: 3 [IU] via SUBCUTANEOUS
  Administered 2014-01-28: 5 [IU] via SUBCUTANEOUS
  Administered 2014-01-29: 7 [IU] via SUBCUTANEOUS
  Administered 2014-01-29: 2 [IU] via SUBCUTANEOUS
  Administered 2014-01-30 (×2): 3 [IU] via SUBCUTANEOUS
  Filled 2014-01-28: qty 1

## 2014-01-28 MED ORDER — ALBUTEROL SULFATE (2.5 MG/3ML) 0.083% IN NEBU
INHALATION_SOLUTION | RESPIRATORY_TRACT | Status: AC
  Administered 2014-01-28: 2.5 mg
  Filled 2014-01-28: qty 3

## 2014-01-28 MED ORDER — SODIUM POLYSTYRENE SULFONATE 15 GM/60ML PO SUSP
30.0000 g | Freq: Once | ORAL | Status: AC
  Administered 2014-01-28: 30 g via ORAL
  Filled 2014-01-28: qty 120

## 2014-01-28 MED ORDER — HYDROCODONE-ACETAMINOPHEN 10-325 MG PO TABS: 1.0000 | ORAL_TABLET | Freq: Four times a day (QID) | ORAL | Status: DC | PRN

## 2014-01-28 MED ORDER — TACROLIMUS 1 MG PO CAPS
3.0000 mg | ORAL_CAPSULE | Freq: Two times a day (BID) | ORAL | Status: DC
  Filled 2014-01-28 (×2): qty 3

## 2014-01-28 MED ORDER — ONDANSETRON HCL 4 MG PO TABS: 4.0000 mg | ORAL_TABLET | Freq: Four times a day (QID) | ORAL | Status: DC | PRN

## 2014-01-28 MED ORDER — ACETAMINOPHEN 650 MG RE SUPP: 650.0000 mg | Freq: Four times a day (QID) | RECTAL | Status: DC | PRN

## 2014-01-28 MED ORDER — FUROSEMIDE 10 MG/ML IJ SOLN
20.0000 mg | Freq: Once | INTRAMUSCULAR | Status: AC
  Administered 2014-01-28: 20 mg via INTRAVENOUS
  Filled 2014-01-28: qty 2

## 2014-01-28 MED ORDER — ONDANSETRON HCL 4 MG/2ML IJ SOLN: 4.0000 mg | Freq: Four times a day (QID) | INTRAMUSCULAR | Status: DC | PRN

## 2014-01-28 MED ORDER — FUROSEMIDE 10 MG/ML IJ SOLN
INTRAMUSCULAR | Status: AC
  Administered 2014-01-28: 60 mg via INTRAVENOUS
  Filled 2014-01-28: qty 8

## 2014-01-28 MED ORDER — TERAZOSIN HCL 5 MG PO CAPS
10.0000 mg | ORAL_CAPSULE | Freq: Every evening | ORAL | Status: DC
  Administered 2014-01-28 – 2014-01-29 (×2): 10 mg via ORAL
  Filled 2014-01-28 (×4): qty 2

## 2014-01-28 MED ORDER — INSULIN NPH (HUMAN) (ISOPHANE) 100 UNIT/ML ~~LOC~~ SUSP
22.0000 [IU] | Freq: Every day | SUBCUTANEOUS | Status: DC
  Administered 2014-01-28 – 2014-01-29 (×2): 22 [IU] via SUBCUTANEOUS
  Filled 2014-01-28: qty 10

## 2014-01-28 MED ORDER — PANTOPRAZOLE SODIUM 40 MG PO TBEC
40.0000 mg | DELAYED_RELEASE_TABLET | Freq: Every day | ORAL | Status: DC
  Administered 2014-01-28 – 2014-01-30 (×3): 40 mg via ORAL
  Filled 2014-01-28 (×2): qty 1

## 2014-01-28 MED ORDER — PREDNISONE 5 MG PO TABS
5.0000 mg | ORAL_TABLET | Freq: Every day | ORAL | Status: DC
  Administered 2014-01-28: 5 mg via ORAL
  Filled 2014-01-28: qty 1

## 2014-01-28 MED ORDER — FUROSEMIDE 10 MG/ML IJ SOLN
60.0000 mg | Freq: Two times a day (BID) | INTRAMUSCULAR | Status: DC
  Administered 2014-01-28 – 2014-01-29 (×2): 60 mg via INTRAVENOUS
  Filled 2014-01-28 (×2): qty 6

## 2014-01-28 MED ORDER — MYCOPHENOLATE MOFETIL 250 MG PO CAPS
500.0000 mg | ORAL_CAPSULE | Freq: Two times a day (BID) | ORAL | Status: DC
  Filled 2014-01-28 (×2): qty 2

## 2014-01-28 MED ORDER — BIOTENE DRY MOUTH MT LIQD
15.0000 mL | Freq: Two times a day (BID) | OROMUCOSAL | Status: DC
  Administered 2014-01-28 – 2014-01-30 (×4): 15 mL via OROMUCOSAL

## 2014-01-28 MED ORDER — TACROLIMUS 1 MG PO CAPS
1.0000 mg | ORAL_CAPSULE | Freq: Two times a day (BID) | ORAL | Status: DC
  Administered 2014-01-28 – 2014-01-30 (×4): 1 mg via ORAL
  Filled 2014-01-28 (×5): qty 1

## 2014-01-28 MED ORDER — ASPIRIN EC 325 MG PO TBEC
325.0000 mg | DELAYED_RELEASE_TABLET | Freq: Every day | ORAL | Status: DC
  Administered 2014-01-28 – 2014-01-30 (×3): 325 mg via ORAL
  Filled 2014-01-28 (×3): qty 1

## 2014-01-28 MED ORDER — FLUCONAZOLE 200 MG PO TABS: 400.0000 mg | ORAL_TABLET | Freq: Every day | ORAL | Status: DC

## 2014-01-28 MED ORDER — ACETAMINOPHEN 325 MG PO TABS: 650.0000 mg | ORAL_TABLET | Freq: Four times a day (QID) | ORAL | Status: DC | PRN

## 2014-01-28 MED ORDER — SODIUM CHLORIDE 0.9 % IJ SOLN
3.0000 mL | Freq: Two times a day (BID) | INTRAMUSCULAR | Status: DC
  Administered 2014-01-28 – 2014-01-30 (×5): 3 mL via INTRAVENOUS
  Filled 2014-01-28: qty 3

## 2014-01-28 MED ORDER — FLUCONAZOLE 200 MG PO TABS
200.0000 mg | ORAL_TABLET | Freq: Every day | ORAL | Status: DC
  Administered 2014-01-28: 200 mg via ORAL
  Filled 2014-01-28: qty 2

## 2014-01-28 MED ORDER — VITAMIN B-12 1000 MCG PO TABS
1000.0000 ug | ORAL_TABLET | Freq: Every day | ORAL | Status: DC
  Administered 2014-01-28 – 2014-01-30 (×3): 1000 ug via ORAL
  Filled 2014-01-28 (×3): qty 1

## 2014-01-28 MED ORDER — PREDNISONE 2.5 MG PO TABS
2.5000 mg | ORAL_TABLET | Freq: Every evening | ORAL | Status: DC
  Administered 2014-01-28 – 2014-01-29 (×2): 2.5 mg via ORAL
  Filled 2014-01-28 (×3): qty 1

## 2014-01-28 MED ORDER — ALLOPURINOL 100 MG PO TABS
100.0000 mg | ORAL_TABLET | Freq: Every morning | ORAL | Status: DC
  Administered 2014-01-28 – 2014-01-30 (×3): 100 mg via ORAL
  Filled 2014-01-28 (×3): qty 1

## 2014-01-28 NOTE — ED Notes (Signed)
Patient transported to Ultrasound 

## 2014-01-28 NOTE — ED Notes (Signed)
CBG 118 

## 2014-01-28 NOTE — ED Notes (Signed)
Patient ate his whole plate

## 2014-01-28 NOTE — ED Notes (Signed)
CBG 216 

## 2014-01-28 NOTE — ED Notes (Signed)
Respiratory tech paged for CPAP.

## 2014-01-28 NOTE — ED Notes (Signed)
Pt requesting for CPAP to be removed. Respiratory tech made aware and CPAP removed. Dr. Rhunette Croft to be made aware.

## 2014-01-28 NOTE — Progress Notes (Signed)
[facility] TEAM 1 - Stepdown/ICU TEAM Progress Note  Jon MachoChester Andreoni ZOX:[id number] DOB: [birth year] DOA: 01/28/2014 PCP: Marin CommentWELLS, CHERYL, FNP  Admit HPI / Brief Narrative: 73 y.o. 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 Patient had recently [facility] and at that time was advised to increase his Lasix to 60 mg by mouth twice a day but hold if blood pressure decreased. Patient increase his dose of Lasix to 60 mg twice a day for 24hrs following which his blood pressure dropped to the 90s. He decreased his Lasix back to his prior dose of 40 mg twice a day. Patient had been gradually getting short of breath since then.   In the ER he was initially placed on CPAP and was given Lasix 80 mg IV. Patient's labs show elevated creatinine from his baseline. [facility] prior noted creatinine around 2.3 v/s 2.8 at presentation.   HPI/Subjective: Pt seen for f/u exam.  [facility] via Epic, to include proper current medication doses.    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 rate 30, height 6\' 1"  (1.854 m), weight 93.8 kg (206 lb 12.7 oz), SpO2 93.00%.  Intake/Output Summary (Last 24 hours) at 01/28/14 1552 Last data filed at 01/28/14 1400  Gross per 24 hour  Intake    243 ml  Output   1465 ml  Net  -1222 ml   Exam: Pt seen for f/u exam  Data Reviewed: Basic Metabolic Panel:  Recent Labs Lab 01/28/14 0241 01/28/14 0905  NA 134* 136*  K 5.7* 6.0*  CL 94* 94*  CO2 27 25  GLUCOSE 121* 120*  BUN 70* 70*  CREATININE 2.89* 2.74*  CALCIUM 9.3 9.0   Liver Function Tests:  Recent Labs Lab 01/28/14 0905  AST 27  ALT 13  ALKPHOS 64  BILITOT 0.3  PROT 6.0  ALBUMIN 2.9*   No results found for this basename: LIPASE, AMYLASE,  in the last 168 hours No results found for this basename: AMMONIA,  in the last 168 hours CBC:  Recent Labs Lab 01/28/14 0241 01/28/14 0905  WBC 7.9 7.0  NEUTROABS  --  5.5  HGB 9.9* 10.0*  HCT 32.8* 32.8*  MCV 80.8 80.4  PLT 233 217   Cardiac Enzymes:  Recent Labs Lab 01/28/14 0549 01/28/14 0905 01/28/14 1325  TROPONINI <0.30 <0.30 <0.30   BNP (last 3 results)  Recent  Labs  05/17/13 0425 08/26/13 0452 01/28/14 0241  PROBNP 7029.0* 3270.0* 33416.0*   CBG:  Recent Labs Lab 01/28/14 0739 01/28/14 0905 01/28/14 1219  GLUCAP 118* 120* 218*    No results found for this or any previous visit (from the past 240 hour(s)).   Studies:  Recent x-ray studies have been reviewed in detail by the Attending Physician  Scheduled Meds:  Scheduled Meds: . allopurinol  100 mg Oral q morning - 10a  . antiseptic oral rinse  15 mL Mouth Rinse BID  . aspirin EC  325 mg Oral Daily  . enoxaparin (LOVENOX) injection  30 mg Subcutaneous Daily  . finasteride  5 mg Oral Daily  . fluconazole  200 mg Oral Daily  . insulin  aspart  0-9 Units Subcutaneous TID WC  . insulin NPH Human  22 Units Subcutaneous QHS  . mycophenolate  500 mg Oral BID  . pantoprazole  40 mg Oral Daily  . predniSONE  5 mg Oral Daily  . sodium chloride  3 mL Intravenous Q12H  . tacrolimus  3 mg Oral BID  . terazosin  10 mg Oral QPM  . cyanocobalamin  1,000 mcg Oral Daily    Time spent on care of this patient: 25+ mins   Lonia Blood , MD   [facility] Office  [phone] Pager - Text Page per Loretha Stapler as per below:  On-Call/Text Page:      [email or web address]      password [email or web address]  If 7PM-7AM, please contact night-coverage [email or web address] Password [email or web address] 01/28/2014, 3:52 PM   LOS: 0 days

## 2014-01-28 NOTE — Progress Notes (Signed)
VASCULAR LAB PRELIMINARY  PRELIMINARY  PRELIMINARY  PRELIMINARY  Bilateral lower extremity venous duplex  completed.    Preliminary report:  Bilateral:  No evidence of DVT, superficial thrombosis, or Baker's Cyst.    Gara Kroner, RVT 01/28/2014, 8:34 AM

## 2014-01-28 NOTE — ED Notes (Signed)
Patient has hx of heart failure.  He reports he has had increasing sob all week.  Tonight it is worse.  Patient states he cannot lay down due to sob.  Patient also has hx of kidney transplant 2011.  Patient denies chest pain.  He reports he knows it is fluid in his lungs.  Patient is seen by Dr Eden Emms.

## 2014-01-28 NOTE — ED Provider Notes (Addendum)
CSN: [id number]     Arrival date & time 01/28/14  0215 History   First MD Initiated Contact with Patient 01/28/14 0257     Chief Complaint  Patient presents with  . Shortness of Breath     (Consider location/radiation/quality/duration/timing/severity/associated sxs/prior Treatment) HPI Comments: Pt comes in with cc of DIB. Pt has hx of diastolic CHF, Renal transplant with CKD, HTN, DM. Pt's wife reports that patient has been having some DIB for the past few days. He however has gotten worse over the last 2 days, and now his breathing is shallow, and he can't lay supine. Pt has no chest pain. There is no n/v/f/c. Pt has no leg swelling. There is no hx of DVT, PE. Pt has had a recent fall with fracture to his left ankle. Pt has had similar episodes few times over the last year, with need for bipap and lasix. Pt takes 20 mg lasix usually, and increased the dose to 60 recently - with no improvement.  Patient is a 73 y.o. male presenting with shortness of breath. The history is provided by the patient.  Shortness of Breath Associated symptoms: no abdominal pain, no chest pain and no cough     Past Medical History  Diagnosis Date  . Hypertension   . CHF (congestive heart failure)   . Hypercholesteremia   . Heart murmur   . Pneumonia 2013  . Shortness of breath     "can come on at anytime lately; sometimes related to his CHF" (08/27/2013)  . Sleep apnea     "positively has it but never tested"/daughter @ bedside (08/27/2013)  . Type II diabetes mellitus   . GERD (gastroesophageal reflux disease)   . Gout   . 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 Kidney transplant Right 05/14/2010  . Av fistula placement Left July 2011  . Nephrectomy transplanted organ    . Shoulder open rotator cuff repair Right 1990's  . Shoulder arthroscopy w/ rotator cuff repair Left 1990's  . Cataract extraction w/ intraocular lens  implant,  bilateral  2014   Family History  Problem Relation Age of Onset  . Cancer Mother     unsure of type   History  Substance Use Topics  . Smoking status: Former Smoker -- 1.50 packs/day for 40 years    Types: Cigarettes    Quit date: 09/02/1992  . Smokeless tobacco: Never Used  . Alcohol Use: No    Review of Systems  Constitutional: Negative for activity change and appetite change.  Respiratory: Positive for shortness of breath. Negative for cough.   Cardiovascular: Negative for chest pain.  Gastrointestinal: Negative for abdominal pain.  Genitourinary: Negative for dysuria.  All other systems reviewed and are negative.     Allergies  Review of patient's allergies indicates no known allergies.  Home Medications   Prior to Admission medications   Medication Sig Start Date End Date Taking? Authorizing Provider  allopurinol (ZYLOPRIM) 100 MG tablet Take 100 mg by mouth every morning.   Yes Historical Provider, MD  aspirin EC 81 MG tablet Take 81 mg by mouth every other day. Take 1 tab three timed a week 08/29/13  Yes Maryann Mikhail, DO  Calcium Carbonate-Vitamin D (CALTRATE 600+D PO) Take 1 tablet by mouth 2 (two) times daily.   Yes Historical Provider, MD  cyanocobalamin 1000 MCG tablet Take 1,000 mcg by mouth daily.   Yes Historical Provider,  MD  finasteride (PROSCAR) 5 MG tablet Take 5 mg by mouth daily.   Yes Historical Provider, MD  fluconazole (DIFLUCAN) 200 MG tablet Take 200 mg by mouth daily.   Yes Historical Provider, MD  furosemide (LASIX) 20 MG tablet Take 60 mg by mouth daily.  08/29/13  Yes Maryann Mikhail, DO  glucagon 1 MG injection Inject 1 mg into the vein once as needed. 11/10/13  Yes Romero BellingSean Ellison, MD  HYDROcodone-acetaminophen (NORCO) 10-325 MG per tablet Take 1 tablet by mouth every 6 (six) hours as needed for moderate pain.   Yes Historical Provider, MD  insulin aspart (NOVOLOG) 100 UNIT/ML FlexPen 3 times a day (just before each meal) 08-12-11 units, and  pen needles 4/day. 11/10/13  Yes Romero BellingSean Ellison, MD  Insulin Isophane Human (HUMULIN N KWIKPEN) 100 UNIT/ML Kiwkpen Inject 22 Units into the skin at bedtime. 11/10/13  Yes Romero BellingSean Ellison, MD  mycophenolate (CELLCEPT) 250 MG capsule Take 500 mg by mouth 2 (two) times daily.    Yes Historical Provider, MD  omeprazole (PRILOSEC) 20 MG capsule Take 20 mg by mouth daily.   Yes Historical Provider, MD  predniSONE (DELTASONE) 5 MG tablet Take 5 mg by mouth daily.   Yes Historical Provider, MD  tacrolimus (PROGRAF) 1 MG capsule Take 3 mg by mouth 2 (two) times daily.    Yes Historical Provider, MD  terazosin (HYTRIN) 10 MG capsule Take 10 mg by mouth every evening.    Yes Historical Provider, MD   BP 131/80  Pulse 90  Temp(Src) 97.5 F (36.4 C) (Oral)  Resp 24  SpO2 92% Physical Exam  Nursing note and vitals reviewed. Constitutional: He is oriented to person, place, and time. He appears well-developed.  HENT:  Head: Normocephalic and atraumatic.  Eyes: Conjunctivae and EOM are normal. Pupils are equal, round, and reactive to light.  Neck: Normal range of motion. Neck supple.  Cardiovascular: Normal rate and regular rhythm.   Pulmonary/Chest: Breath sounds normal. He is in respiratory distress.  Pt's RR is 32, shallow respiration. Lung exam reveals poor aeration in the lower lung fields.  Abdominal: Soft. Bowel sounds are normal. He exhibits no distension. There is no tenderness. There is no rebound and no guarding.  Neurological: He is alert and oriented to person, place, and time.  Skin: Skin is warm.    ED Course  Procedures (including critical care time) Labs Review Labs Reviewed  CBC - Abnormal; Notable for the following:    RBC 4.06 (*)    Hemoglobin 9.9 (*)    HCT 32.8 (*)    MCH 24.4 (*)    RDW 17.1 (*)    All other components within normal limits  BASIC METABOLIC PANEL - Abnormal; Notable for the following:    Sodium 134 (*)    Potassium 5.7 (*)    Chloride 94 (*)    Glucose,  Bld 121 (*)    BUN 70 (*)    Creatinine, Ser 2.89 (*)    GFR calc non Af Amer 20 (*)    GFR calc Af Amer 24 (*)    All other components within normal limits  PRO B NATRIURETIC PEPTIDE - Abnormal; Notable for the following:    Pro B Natriuretic peptide (BNP) 33416.0 (*)    All other components within normal limits  I-STAT TROPOININ, ED - Abnormal; Notable for the following:    Troponin i, poc 0.09 (*)    All other components within normal limits  URINALYSIS, ROUTINE W REFLEX  MICROSCOPIC  SODIUM, URINE, RANDOM  CREATININE, URINE, RANDOM    Imaging Review Dg Chest 2 View  01/28/2014   CLINICAL DATA:  Shortness of breath and wheezing.  EXAM: CHEST  2 VIEW  COMPARISON:  Chest radiograph performed 08/27/2013, and CT of the chest performed 10/11/2013  FINDINGS: Known pulmonary nodules are not well characterized on radiograph.  The lungs are well-aerated. Vascular congestion is noted, with increased interstitial markings, likely reflecting mild pulmonary edema. No pleural effusion or pneumothorax is seen.  The heart is normal in size; the mediastinal contour is within normal limits. No acute osseous abnormalities are seen.  IMPRESSION: Vascular congestion, with increased interstitial markings, raising concern for mild pulmonary edema. Known pulmonary nodules are not well characterized on radiograph.   Electronically Signed   By: Roanna Raider M.D.   On: 01/28/2014 02:54     EKG Interpretation   Date/Time:  Friday Jan 28 2014 03:01:18 EDT Ventricular Rate:  105 PR Interval:  180 QRS Duration: 117 QT Interval:  387 QTC Calculation: 511 R Axis:   61 Text Interpretation:  Age not entered, assumed to be  73 years old for  purpose of ECG interpretation Sinus tachycardia LVH with secondary  repolarization abnormality Prolonged QT interval Confirmed by Rhunette Croft,  MD, Janey Genta [phone]) on 01/28/2014 5:15:50 AM      EKG Interpretation  REPEAT: Ventricular Rate:  106 PR Interval:  180 QRS  Duration: 117 QT Interval:  387 QTC Calculation: 511 R Axis:   61 Text Interpretation:  Age not entered, assumed to be  73 years old for  purpose of ECG interpretation Sinus tachycardia LVH with secondary  repolarization abnormality Prolonged QT interval Confirmed by Rhunette Croft,  MD, Janey Genta ([id number]) on 01/28/2014 5:15:50 AM       MDM   Final diagnoses:  Pulmonary edema  Respiratory failure with hypoxia  AKI (acute kidney injury)   Pt comes in with cc of DIB. Hx of CHF. Exam consistent with CHF exacerbation - with poor air movement at the bases. Hx also revealing PND and orthopnea and previous admissions for similar reasons.  Pt is in respiratory distress - and CPAP started.  Lasix 20 mg iv given right off the bat - with minimal response, so iv 60 mg added. Pt has troponin of 0.9 - suspect that to be type 2 or demand ischemia. EKG has some ST elevation - J point elevation. BNP > 30K Initially placed on CPAP - and responded. He asked for cpap to be removed  Finally, PE considered in the dx. Pt not very mobile and has had a recent orthopedic injury - however, given hx of similar sx, clear evidence of pulm edmea, and AKI with renal transplant, we will start tx for CHF and have admitting team assess for PE if needed.  CRITICAL CARE Performed by: Wai Minotti   Total critical care time: 60 min  Critical care time was exclusive of separately billable procedures and treating other patients.  Critical care was necessary to treat or prevent imminent or life-threatening deterioration.  Critical care was time spent personally by me on the following activities: development of treatment plan with patient and/or surrogate as well as nursing, discussions with consultants, evaluation of patient's response to treatment, examination of patient, obtaining history from patient or surrogate, ordering and performing treatments and interventions, ordering and review of laboratory studies, ordering  and review of radiographic studies, pulse oximetry and re-evaluation of patient's condition.     Derwood Kaplan, MD 01/28/14 [phone]  Derwood Kaplan, MD 01/28/14 [phone]

## 2014-01-28 NOTE — ED Notes (Signed)
MD at bedside. 

## 2014-01-28 NOTE — Progress Notes (Signed)
Placed patient on CPAP per doctors orders. Placed patient on Cpap via auto-mode with minimum pressure set at 6cm and maximum pressure set at 20cm. Sp02 at this time is 95%,will continue to monitor patient

## 2014-01-28 NOTE — H&P (Addendum)
[facility] History and Physical  Shivaay Stormont ZOX:[id number] DOB: [birth year] DOA: 01/28/2014  Referring physician: ER physician. PCP: Marin Comment, FNP   Chief Complaint: Shortness of breath.  HPI: Jon Burns is a 73 y.o. 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 Patient had recently followed up with his nephrologist at [facility] and at that time was advised to increase his Lasix to 60 mg by mouth twice a day but hold if blood pressure decreases. Last Monday patient increase his dose of Lasix to 60 mg twice a day following which after one day his blood pressure dropped to the 90s. He decrease his Lasix back to his home dose of 40 mg twice a day. Patient has been gradually getting short of breath since then. In the ER he was initially placed on CPAP and was given Lasix total of 80 mg IV. Patient's labs show elevated creatinine from his baseline. Labs done at [facility] 5 days ago shows creatinine around 2.3 and today it is around 2.8. Patient last week also had sustained fracture of his right foot and is on cast. Patient denies any nausea vomiting abdominal pain diarrhea chest pain productive cough.   Review of Systems: As presented in the history of presenting illness, rest negative.  Past Medical History  Diagnosis Date  . Hypertension   . CHF (congestive heart failure)   . Hypercholesteremia   . Heart murmur   . Pneumonia 2013  . Shortness of breath     "can come on at anytime lately; sometimes related to his CHF" (08/27/2013)  . Sleep apnea     "positively has it but never tested"/daughter @ bedside (08/27/2013)  . Type II diabetes mellitus   . GERD (gastroesophageal reflux disease)   . Gout   . Renal disorder     R kidney transplant 05/14/10   Past Surgical History  Procedure  Laterality Date  . Kidney transplant Right 05/14/2010  . Av fistula placement Left July 2011  . Nephrectomy transplanted organ    . Shoulder open rotator cuff repair Right 1990's  . Shoulder arthroscopy w/ rotator cuff repair Left 1990's  . Cataract extraction w/ intraocular lens  implant, bilateral  2014   Social History:  reports that he quit smoking about 21 years ago. His smoking use included Cigarettes. He has a 60 pack-year smoking history. He has never used smokeless tobacco. He reports that he does not drink alcohol or use illicit drugs. Where does patient live home Can patient participate in ADLs? Yes.  No Known Allergies  Family History:  Family History  Problem Relation Age of Onset  . Cancer Mother     unsure of type      Prior to Admission medications   Medication Sig Start Date End Date Taking? Authorizing Provider  allopurinol (ZYLOPRIM) 100 MG tablet Take 100 mg by mouth every morning.   Yes Historical Provider, MD  aspirin EC 81 MG tablet Take 81 mg by mouth every other day. Take 1 tab three timed a week 08/29/13  Yes Maryann Mikhail, DO  Calcium Carbonate-Vitamin D (CALTRATE 600+D PO) Take 1 tablet by mouth 2 (two) times daily.   Yes Historical Provider, MD  cyanocobalamin 1000 MCG tablet Take 1,000 mcg by mouth daily.   Yes Historical Provider, MD  finasteride (PROSCAR) 5 MG tablet Take 5 mg  by mouth daily.   Yes Historical Provider, MD  fluconazole (DIFLUCAN) 200 MG tablet Take 200 mg by mouth daily.   Yes Historical Provider, MD  furosemide (LASIX) 20 MG tablet Take 60 mg by mouth daily.  08/29/13  Yes Maryann Mikhail, DO  glucagon 1 MG injection Inject 1 mg into the vein once as needed. 11/10/13  Yes Romero Belling, MD  HYDROcodone-acetaminophen (NORCO) 10-325 MG per tablet Take 1 tablet by mouth every 6 (six) hours as needed for moderate pain.   Yes Historical Provider, MD  insulin aspart (NOVOLOG) 100 UNIT/ML FlexPen 3 times a day (just before each meal) 08-12-11  units, and pen needles 4/day. 11/10/13  Yes Romero Belling, MD  Insulin Isophane Human (HUMULIN N KWIKPEN) 100 UNIT/ML Kiwkpen Inject 22 Units into the skin at bedtime. 11/10/13  Yes Romero Belling, MD  mycophenolate (CELLCEPT) 250 MG capsule Take 500 mg by mouth 2 (two) times daily.    Yes Historical Provider, MD  omeprazole (PRILOSEC) 20 MG capsule Take 20 mg by mouth daily.   Yes Historical Provider, MD  predniSONE (DELTASONE) 5 MG tablet Take 5 mg by mouth daily.   Yes Historical Provider, MD  tacrolimus (PROGRAF) 1 MG capsule Take 3 mg by mouth 2 (two) times daily.    Yes Historical Provider, MD  terazosin (HYTRIN) 10 MG capsule Take 10 mg by mouth every evening.    Yes Historical Provider, MD    Physical Exam: Filed Vitals:   01/28/14 0415 01/28/14 0430 01/28/14 0452 01/28/14 0452  BP: 125/82 131/80    Pulse: 92 90    Temp:      TempSrc:      Resp: 23 24    SpO2: 92% 93% 88% 92%     General:  Well developed and moderately nourished.  Eyes: Anicteric no pallor.  ENT: No discharge from the ears eyes nose mouth  Neck: No mass felt.  Cardiovascular: S1-S2 heard.  Respiratory: No rhonchi or crepitations.  Abdomen: Soft nontender bowel sounds present. No guarding or rigidity.  Skin: No rash. There is small bruise.  Musculoskeletal: Right leg in brace.  Psychiatric: Appears normal.  Neurologic: Alert awake oriented to time place and person. Moves all extremities.  Labs on Admission:  Basic Metabolic Panel:  Recent Labs Lab 01/28/14 0241  NA 134*  K 5.7*  CL 94*  CO2 27  GLUCOSE 121*  BUN 70*  CREATININE 2.89*  CALCIUM 9.3   Liver Function Tests: No results found for this basename: AST, ALT, ALKPHOS, BILITOT, PROT, ALBUMIN,  in the last 168 hours No results found for this basename: LIPASE, AMYLASE,  in the last 168 hours No results found for this basename: AMMONIA,  in the last 168 hours CBC:  Recent Labs Lab 01/28/14 0241  WBC 7.9  HGB 9.9*  HCT 32.8*   MCV 80.8  PLT 233   Cardiac Enzymes: No results found for this basename: CKTOTAL, CKMB, CKMBINDEX, TROPONINI,  in the last 168 hours  BNP (last 3 results)  Recent Labs  05/17/13 0425 08/26/13 0452 01/28/14 0241  PROBNP 7029.0* 3270.0* 33416.0*   CBG: No results found for this basename: GLUCAP,  in the last 168 hours  Radiological Exams on Admission: Dg Chest 2 View  01/28/2014   CLINICAL DATA:  Shortness of breath and wheezing.  EXAM: CHEST  2 VIEW  COMPARISON:  Chest radiograph performed 08/27/2013, and CT of the chest performed 10/11/2013  FINDINGS: Known pulmonary nodules are not well characterized on radiograph.  The lungs are well-aerated. Vascular congestion is noted, with increased interstitial markings, likely reflecting mild pulmonary edema. No pleural effusion or pneumothorax is seen.  The heart is normal in size; the mediastinal contour is within normal limits. No acute osseous abnormalities are seen.  IMPRESSION: Vascular congestion, with increased interstitial markings, raising concern for mild pulmonary edema. Known pulmonary nodules are not well characterized on radiograph.   Electronically Signed   By: Roanna RaiderJeffery  Chang M.D.   On: 01/28/2014 02:54    EKG: Independently reviewed. Sinus tachycardia with nonspecific ST-T changes.  Assessment/Plan Principal Problem:   Pulmonary edema Active Problems:   HTN (hypertension)   History of renal transplantation   Acute pulmonary edema   Type I (juvenile type) diabetes mellitus with renal manifestations, not stated as uncontrolled   Acute renal failure   1. Acute pulmonary edema with history of diastolic CHF last EF was 50-55% - patient was given a total of 80 mg IV Lasix. Closely follow intake output and metabolic panel. Probably may need nephrology for given patient's worsening renal function. Given patient's recent right leg fracture Dopplers have been ordered to rule out DVT. 2. Acute renal failure in a transplanted  kidney - UA and urine sodium and creatinine are pending. May need nephrology given renal transplant. Continue antirejection medications. 3. Elevated troponin - ER physician had discussed with on-call cardiologist who had check the EKG and stated that he findings are nonspecific and to cycle cardiac markers. 4. Diabetes mellitus -  Continue home medications closely follow CBGs. 5. Anemia - probably from chronic kidney disease. 6. On fluconazole for lung cryptococcal infection. 7. 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 8. Recent right leg fracture.    Code Status:  full code.  Family Communication:  patient's wife at the bedside.  Disposition Plan:  admit to inpatient.    Eduard ClosArshad N Saqib Cazarez [facility] Pager [phone].  If 7PM-7AM, please contact night-coverage [email or web address] Password [email or web address] 01/28/2014, 5:57 AM

## 2014-01-29 LAB — GLUCOSE, CAPILLARY
GLUCOSE-CAPILLARY: 117 mg/dL — AB (ref 70–99)
Glucose-Capillary: 188 mg/dL — ABNORMAL HIGH (ref 70–99)
Glucose-Capillary: 333 mg/dL — ABNORMAL HIGH (ref 70–99)
Glucose-Capillary: 278 mg/dL — ABNORMAL HIGH (ref 70–99)

## 2014-01-29 LAB — BASIC METABOLIC PANEL
BUN: 75 mg/dL — ABNORMAL HIGH (ref 6–23)
CO2: 28 mEq/L (ref 19–32)
Calcium: 8.8 mg/dL (ref 8.4–10.5)
Chloride: 95 mEq/L — ABNORMAL LOW (ref 96–112)
Creatinine, Ser: 2.4 mg/dL — ABNORMAL HIGH (ref 0.50–1.35)
GFR calc Af Amer: 30 mL/min — ABNORMAL LOW (ref >90)
GFR, EST NON AFRICAN AMERICAN: 26 mL/min — AB (ref >90)
GLUCOSE: 153 mg/dL — AB (ref 70–99)
POTASSIUM: 5 meq/L (ref 3.7–5.3)
Sodium: 137 mEq/L (ref 137–147)

## 2014-01-29 LAB — CBC
HEMATOCRIT: 32.2 % — AB (ref 39.0–52.0)
HEMOGLOBIN: 9.6 g/dL — AB (ref 13.0–17.0)
MCH: 23.9 pg — AB (ref 26.0–34.0)
MCHC: 29.8 g/dL — AB (ref 30.0–36.0)
MCV: 80.1 fL (ref 78.0–100.0)
Platelets: 221 10*3/uL (ref 150–400)
RBC: 4.02 MIL/uL — ABNORMAL LOW (ref 4.22–5.81)
RDW: 17.1 % — ABNORMAL HIGH (ref 11.5–15.5)
WBC: 7.6 10*3/uL (ref 4.0–10.5)

## 2014-01-29 MED ORDER — FUROSEMIDE 40 MG PO TABS
40.0000 mg | ORAL_TABLET | Freq: Two times a day (BID) | ORAL | Status: DC
  Administered 2014-01-30: 40 mg via ORAL
  Filled 2014-01-29 (×3): qty 1

## 2014-01-29 MED ORDER — MAGNESIUM OXIDE 400 MG PO TABS: 400.0000 mg | ORAL_TABLET | Freq: Every day | ORAL | Status: DC

## 2014-01-29 MED ORDER — INSULIN ASPART 100 UNIT/ML ~~LOC~~ SOLN
6.0000 [IU] | Freq: Three times a day (TID) | SUBCUTANEOUS | Status: DC
  Administered 2014-01-30 (×2): 6 [IU] via SUBCUTANEOUS

## 2014-01-29 MED ORDER — MAGNESIUM OXIDE 400 (241.3 MG) MG PO TABS
400.0000 mg | ORAL_TABLET | Freq: Every day | ORAL | Status: DC
  Administered 2014-01-29 – 2014-01-30 (×2): 400 mg via ORAL
  Filled 2014-01-29 (×3): qty 1

## 2014-01-29 NOTE — Progress Notes (Signed)
[place] TEAM 1 - Stepdown/ICU TEAM Progress Note  Jon Burns ZOX:[id number] DOB: [birth year] DOA: 01/28/2014 PCP: Marin Comment, FNP  Admit HPI / Brief Narrative: 73 y.o. 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 Patient had recently followed up with his Nephrologist at [facility] and at that time was advised to increase his Lasix to 60 mg by mouth twice a day but hold if blood pressure decreased. Patient increase his dose of Lasix to 60 mg twice a day for 24hrs following which his blood pressure dropped to the 90s. He decreased his Lasix back to his prior dose of 40 mg twice a day. Patient had been gradually getting short of breath since then.   In the ER he was initially placed on CPAP and was given Lasix 80 mg IV. Patient's labs show elevated creatinine from his baseline. Labs done at [facility] 5 days prior noted creatinine around 2.3 v/s 2.8 at presentation.   HPI/Subjective: There has been much debate about this patient's proper home medication regimen.  I have prescribed his medication based directly upon the notes from his transplant team on 01/25/2014 at [facility] (viewed via Care Everywhere) which state as follows:  "off MMF with disseminated crypto" - from same note: "continue FK 1/1 (last dose 8:30pm), pred from 5 to 5/2.5" - this is why the patient's meds are currently prescribed as they are, based directly upon the note from his prescriber at [facility]  The patient states he feels "100% better today."  He denies cp, nv, abdom pain, f/c, or sob.  He states he feels better that he has "in a long time."  He is anxious to go home.    Assessment/Plan:  Acute pulmonary edema with history of diastolic CHF  last EF was 50-55% - net negative balance of ~4L thus far - BP is tolerating - renal fxn is improving - has  become mildly tachycardic - will return to prior home lasix tx in the morning and hold his dose tonight   Acute renal failure in a transplanted kidney - CKD s/p R renal transplant 2011 Baseline crt reportedly mid 1s per [facility] notes, with recent increase to 2-2.3 - crt now approaching his most recent values at [facility] - recheck in AM - slow diuresis as discussed above   Hyperkalemia Improved with one dose of kayexelate + diuresis   Elevated troponin POC "True" troponin negative x3 - absolutely no chest pain   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) Per [facility] notes "to continue on high dose fluconazole through 04/2014, and has f/up with ID in July" - diflucan dose decreased to 200mg  QD while here on advice of Pharmacy based on GFR  Recent right leg fracture No acute complications  HTN w/ some recent hypotension  BP well controlled   HLD  Sleep apnea  DM2 CBG erratic - resume partial home meal coverage dosing and follow   GERD  Code Status: FULL Family Communication: no family present at time of exam Disposition Plan: SDU  Consultants: none  Procedures: Venous duplex - B negative for DVT   Antibiotics: none   DVT prophylaxis: lovenox   Objective: Blood pressure 123/80, pulse 102, temperature 98.2 F (36.8 C), temperature source Oral, resp. rate 26, height 6\' 1"  (1.854 m), weight 93.8 kg (206 lb 12.7 oz), SpO2 100.00%.  Intake/Output Summary (Last 24 hours)  at 01/29/14 1648 Last data filed at 01/29/14 1517  Gross per 24 hour  Intake      3 ml  Output   1670 ml  Net  -1667 ml   Exam: General: No acute respiratory distress - sitting at side of bed  Lungs: Clear to auscultation bilaterally with exception to only mild bibasilar crackles  Cardiovascular: Regular rate and rhythm without murmur gallop or rub normal S1 and S2 Abdomen: Nontender, nondistended, soft, bowel sounds positive, no rebound, no ascites, no appreciable mass Extremities: No significant cyanosis, clubbing;  trace edema bilateral lower extremities  Data Reviewed: Basic Metabolic Panel:  Recent Labs Lab 01/28/14 0241 01/28/14 0905 01/29/14 0239  NA 134* 136* 137  K 5.7* 6.0* 5.0  CL 94* 94* 95*  CO2 27 25 28   GLUCOSE 121* 120* 153*  BUN 70* 70* 75*  CREATININE 2.89* 2.74* 2.40*  CALCIUM 9.3 9.0 8.8   Liver Function Tests:  Recent Labs Lab 01/28/14 0905  AST 27  ALT 13  ALKPHOS 64  BILITOT 0.3  PROT 6.0  ALBUMIN 2.9*   CBC:  Recent Labs Lab 01/28/14 0241 01/28/14 0905 01/29/14 0239  WBC 7.9 7.0 7.6  NEUTROABS  --  5.5  --   HGB 9.9* 10.0* 9.6*  HCT 32.8* 32.8* 32.2*  MCV 80.8 80.4 80.1  PLT 233 217 221   Cardiac Enzymes:  Recent Labs Lab 01/28/14 0549 01/28/14 0905 01/28/14 1325  TROPONINI <0.30 <0.30 <0.30   BNP (last 3 results)  Recent Labs  05/17/13 0425 08/26/13 0452 01/28/14 0241  PROBNP 7029.0* 3270.0* 33416.0*   CBG:  Recent Labs Lab 01/28/14 1219 01/28/14 1708 01/28/14 2045 01/29/14 0734 01/29/14 1120  GLUCAP 218* 275* 263* 117* 188*    Recent Results (from the past 240 hour(s))  MRSA PCR SCREENING     Status: Abnormal   Collection Time    01/28/14  1:45 PM      Result Value Ref Range Status   MRSA by PCR POSITIVE (*) NEGATIVE Final   Comment:            The GeneXpert MRSA Assay (FDA     approved for NASAL specimens     only), is one component of a     comprehensive MRSA colonization     surveillance program. It is not     intended to diagnose MRSA     infection nor to guide or     monitor treatment for     MRSA infections.     RESULT CALLED TO, READ BACK BY AND VERIFIED WITH:     C. SCHILLER RN 16:10 01/28/14 ([email or web address])     Studies:  Recent x-ray studies have been reviewed in detail by the Attending Physician  Scheduled Meds:  Scheduled Meds: . allopurinol  100 mg Oral q morning - 10a  . antiseptic oral rinse  15 mL Mouth Rinse BID  . aspirin EC  325 mg Oral Daily  . Chlorhexidine Gluconate Cloth  6 each Topical  Q0600  . enoxaparin (LOVENOX) injection  30 mg Subcutaneous Daily  . finasteride  5 mg Oral Daily  . furosemide  60 mg Intravenous BID  . insulin aspart  0-9 Units Subcutaneous TID WC  . insulin NPH Human  22 Units Subcutaneous QHS  . mupirocin ointment  1 application Nasal BID  . pantoprazole  40 mg Oral Daily  . predniSONE  2.5 mg Oral QPM  . predniSONE  5 mg Oral Q breakfast  .  sodium chloride  3 mL Intravenous Q12H  . tacrolimus  1 mg Oral BID  . terazosin  10 mg Oral QPM  . cyanocobalamin  1,000 mcg Oral Daily    Time spent on care of this patient: 35 mins  Lonia BloodJeffrey T Alajiah Dutkiewicz , MD   [facility] Office  [phone] Pager - Text Page per Loretha StaplerAmion as per below:  On-Call/Text Page:      [email or web address]      password [email or web address]  If 7PM-7AM, please contact night-coverage [email or web address] Password [email or web address] 01/29/2014, 4:48 PM   LOS: 1 day

## 2014-01-30 LAB — CBC
HEMATOCRIT: 31.5 % — AB (ref 39.0–52.0)
HEMOGLOBIN: 9.6 g/dL — AB (ref 13.0–17.0)
MCH: 23.9 pg — AB (ref 26.0–34.0)
MCHC: 30.5 g/dL (ref 30.0–36.0)
MCV: 78.6 fL (ref 78.0–100.0)
Platelets: 218 10*3/uL (ref 150–400)
RBC: 4.01 MIL/uL — AB (ref 4.22–5.81)
RDW: 16.9 % — ABNORMAL HIGH (ref 11.5–15.5)
WBC: 6.2 10*3/uL (ref 4.0–10.5)

## 2014-01-30 LAB — GLUCOSE, CAPILLARY
GLUCOSE-CAPILLARY: 207 mg/dL — AB (ref 70–99)
GLUCOSE-CAPILLARY: 210 mg/dL — AB (ref 70–99)

## 2014-01-30 LAB — RENAL FUNCTION PANEL
Albumin: 2.6 g/dL — ABNORMAL LOW (ref 3.5–5.2)
BUN: 68 mg/dL — AB (ref 6–23)
CALCIUM: 8.8 mg/dL (ref 8.4–10.5)
CO2: 29 meq/L (ref 19–32)
CREATININE: 1.85 mg/dL — AB (ref 0.50–1.35)
Chloride: 94 mEq/L — ABNORMAL LOW (ref 96–112)
GFR calc Af Amer: 41 mL/min — ABNORMAL LOW (ref >90)
GFR calc non Af Amer: 35 mL/min — ABNORMAL LOW (ref >90)
GLUCOSE: 229 mg/dL — AB (ref 70–99)
Phosphorus: 3.6 mg/dL (ref 2.3–4.6)
Potassium: 4.7 mEq/L (ref 3.7–5.3)
Sodium: 135 mEq/L — ABNORMAL LOW (ref 137–147)

## 2014-01-30 NOTE — Progress Notes (Signed)
Pt d/c home per MD order, pt VSS, family at [facility], pt and family verbalized understanding of d/c, d/c instructions given, all questions answered

## 2014-01-30 NOTE — Discharge Instructions (Signed)
Pulmonary Edema °Pulmonary edema (PE) is a condition in which fluid collects in the lungs. This makes it hard to breathe. PE may be a result of the heart not pumping very well or a result of injury.  °CAUSES  °· Coronary artery disease causes blockages in the arteries of the heart. This deprives the heart muscle of oxygen and weakens the muscle. A heart attack is a form of coronary artery disease. °· High blood pressure causes the heart muscle to work harder than usual. Over time, the heart muscle may get stiff, and it starts to work less efficiently. It may also fatigue and weaken. °· Viral infection of the heart (myocarditis) may weaken the heart muscle. °· Metabolic conditions such as thyroid disease, excessive alcohol use, certain vitamin deficiencies, or diabetes may also weaken the heart muscle. °· Leaky or stiff heart valves may impair normal heart function. °· Lung disease may strain the heart muscle. °· Excessive demands on the heart such as too much salt or fluid intake. °· Failure to take prescribed medicines. °· Lung injury from heat or toxins, such as poisonous gas. °· Infection in the lungs or other parts of the body. °· Fluid overload caused by kidney failure or medicines. °SYMPTOMS  °· Shortness of breath at rest or with exertion. °· Grunting, wheezing, or gurgling while breathing. °· Feeling like you cannot get enough air. °· Breaths are shallow and fast. °· A lot of coughing with frothy or bloody mucus. °· Skin may become cool, damp, and turn a pale or bluish color. °DIAGNOSIS  °Initial diagnosis may be based on your history, symptoms, and a physical examination. Additional tests for PE may include: °· Electrocardiography. °· Chest X-ray. °· Blood tests. °· Stress test. °· Ultrasound evaluation of the heart (echocardiography). °· Evaluation by a heart doctor (cardiologist). °· Test of the heart arteries to look for blockages (angiography). °· Check of blood oxygen. °TREATMENT  °Treatment of PE will  depend on the underlying cause and will focus first on relieving the symptoms.  °· Extra oxygen to make breathing easier and assist with removing mucus. This may include breathing treatments or a tube into the lungs and a breathing machine. °· Medicine to help the body get rid of extra water, usually through an IV tube. °· Medicine to help the heart pump better. °· If poor heart function is the cause, treatment may include: °· Procedures to open blocked arteries, repair damaged heart valves, or remove some of the damaged heart muscle. °· A pacemaker to help the heart pump with less effort. °HOME CARE INSTRUCTIONS  °· Your health care provider will help you determine what type of exercise program may be helpful. It is important to maintain strength and increase it if possible. Pace your activities to avoid shortness of breath or chest pain. Rest for at least 1 hour before and after meals. Cardiac rehabilitation programs are available in some locations. °· Eat a heart healthy diet low in salt, saturated fat, and cholesterol. Ask for help with choices. °· Make a list of every medicine, vitamin, or herbal supplement you are taking. Keep the list with you at all times. Show it to your health care provider at every visit and before starting a new medicine. Keep the list up to date. °· Ask your health care provider or pharmacist to help you write a plan or schedule so that you know things about each medicine such as: °· Why you are taking it. °· The possible side effects. °·   The best time of day to take it. °· Foods to take with it or avoid. °· When to stop taking it. °· Record your hospital or clinic weight. When you get home, compare it to your scale and record your weight. Then, weigh yourself first thing in the morning daily, and record the weights. You should weigh yourself every morning after you urinate and before you eat breakfast. Wear the same amount of clothing each time you weigh yourself. Provide your health  care provider with your weight record. Daily weights are important in the early recognition of excess fluid. Tell your health care provider right away if you have gained 03 lb/1.4 kg in 1 day, 05 lb/2.3 kg in a week or as directed by your health care provider. Your medicines may need to be adjusted. °· Blood pressure monitoring should be done as often as directed. You can get a home blood pressure cuff at your drugstore. Record these values and bring them with you for your clinic visits. Notify your health care provider if you become dizzy or lightheaded when standing up. °· If you are currently a smoker, it is time to quit. Nicotine makes your heart work harder and is one of the leading causes of cardiac deaths. Do not use nicotine gum or patches before talking to your doctor. °· Make a follow-up appointment with your health care provider as directed. °· Ask your health care provider for a copy of your latest heart tracing (ECG) and keep a copy with you at all times. °SEEK IMMEDIATE MEDICAL CARE IF:  °· You have severe chest pain, especially if the pain is crushing or pressure-like and spreads to the arms, back, neck, or jaw. THIS IS AN EMERGENCY. Do not wait to see if the pain will go away. Call for local emergency medical help. Do not drive yourself to the hospital. °· You have sweating, feel sick to your stomach (nauseous), or are experiencing shortness of breath. °· Your weight increases by 03 lb/1.4 kg in 1 day or 05 lb/2.3 kg in a week. °· You notice increasing shortness of breath that is unusual for you. This may happen during rest, sleep, or with activity. °· You develop chest pain (angina) or pain that is unusual for you. °· You notice more swelling in your hands, feet, ankles or abdomen. °· You notice lasting (persistent) dizziness, blurred vision, headache, or unsteadiness. °· You begin to cough up bloody mucus (sputum). °· You are unable to sleep because it is hard to breathe. °· You begin to feel a  "jumping" or "fluttering" sensation (palpitations) in the chest that is unusual for you. °MAKE SURE YOU: °· Understand these instructions. °· Will watch your condition °· Will get help right away if you are not doing well or get worse. °Document Released: 11/09/2002 Document Revised: 06/09/2013 Document Reviewed: 04/26/2013 °ExitCare® Patient Information ©2014 [employer]. ° °

## 2014-01-30 NOTE — Discharge Summary (Signed)
DISCHARGE SUMMARY  Jon Burns  MR#: [id number]  DOB:1941/02/19  Date of Admission: 01/28/2014 Date of Discharge: 01/30/2014  Attending Physician:Quenton Recendez T Keyra Virella  Patient's WLS:LHTDS, CHERYL, FNP  Consults:  none  Disposition: D/C home   Follow-up Appts:     Follow-up Information   Follow up with Marin Comment, FNP In 1 week.   Specialty:  Nurse Practitioner   Contact information:   [address] 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 Patient had recently followed up with his Nephrologist at [facility] and at that time was advised to increase his Lasix to 60 mg by mouth twice a day but hold if blood pressure decreased. Patient increase his dose of Lasix to 60 mg twice a day for 24hrs following which his blood pressure dropped to the 90s. He decreased his Lasix back to his prior dose of 40 mg twice a day. Patient had been gradually getting short of breath since then.   In the ER he was initially placed on CPAP and was given Lasix 80 mg IV. Patient's labs show elevated creatinine from his baseline. Labs done at [facility] 5 days prior noted creatinine around 2.3 v/s 2.8 at presentation.   Hospital Course:  Acute pulmonary edema with history of diastolic CHF  last EF was 50-55% - net negative balance of ~5.3L during his hospital stay - BP tolerated diuresis - renal fxn is improving - returned to  prior home lasix tx for d/c - further hx reveals salt indiscretion which is likely the cause of his volume overload - he has been counseled to severely limit salt intake  Acute renal failure in a transplanted kidney - CKD s/p R renal transplant 2011  Baseline crt reportedly mid 1s per [facility] notes, with recent increase to 2-2.3 - crt steadily improved th/o the hospital stay  Hyperkalemia  Improved with one dose of kayexelate + diuresis - cautioned pt to avoid salt substitutes that often contain KCl   Elevated troponin POC  "True" troponin negative x3 - absolutely no chest pain   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)  Per [facility] notes "to continue on high dose fluconazole through 04/2014, and has f/up with ID in July" - diflucan dose decreased to 200mg  QD while here on advice of Pharmacy based on GFR, but w/ improved GFR will resume his prior dose at time of d/c   Recent right leg fracture  No acute complications   HTN w/ some recent hypotension  BP well controlled   HLD   Sleep apnea   DM2  CBG erratic - advised pt to follow a diabetic diet at home - resume usual regimen at time of d/c    GERD     Medication List   TAKE these medications       allopurinol 100 MG tablet  Commonly known as:  ZYLOPRIM  Take 100 mg by mouth every morning.     aspirin EC  81 MG tablet  Take 81 mg by mouth every Monday, Wednesday, and Friday.     CALTRATE 600+D PO  Take 1 tablet by mouth 2 (two) times daily.     cyanocobalamin 1000 MCG tablet  Take 1,000 mcg by mouth daily.     finasteride 5 MG tablet  Commonly known as:  PROSCAR  Take 5 mg by mouth daily.     fluconazole 200 MG tablet  Commonly known as:  DIFLUCAN  Take 400 mg by mouth daily.     furosemide 20 MG tablet  Commonly known as:  LASIX  Take 40 mg by mouth 2 (two) times daily.     glucagon 1 MG injection  Inject 1 mg into the vein once as needed.     HYDROcodone-acetaminophen 10-325 MG per tablet  Commonly known as:  NORCO   Take 1 tablet by mouth daily.     insulin NPH Human 100 UNIT/ML injection  Commonly known as:  HUMULIN N,NOVOLIN N  Inject 22 Units into the skin at bedtime.     insulin regular 100 units/mL injection  Commonly known as:  NOVOLIN R,HUMULIN R  Inject 12 Units into the skin 3 (three) times daily before meals.     magnesium oxide 400 MG tablet  Commonly known as:  MAG-OX  Take 400 mg by mouth daily.     omeprazole 20 MG capsule  Commonly known as:  PRILOSEC  Take 20 mg by mouth daily.     predniSONE 5 MG tablet  Commonly known as:  DELTASONE  Take 2.5-5 mg by mouth 2 (two) times daily with a meal. *takes 5mg  in the morning and 2.5mg  at night*     tacrolimus 1 MG capsule  Commonly known as:  PROGRAF  Take 1 mg by mouth 2 (two) times daily.     terazosin 10 MG capsule  Commonly known as:  HYTRIN  Take 10 mg by mouth every evening.       Day of Discharge BP 121/71  Pulse 94  Temp(Src) 97.6 F (36.4 C) (Oral)  Resp 34  Ht 6\' 1"  (1.854 m)  Wt 91.9 kg (202 lb 9.6 oz)  BMI 26.74 kg/m2  SpO2 96%  Physical Exam: General: No acute respiratory distress Lungs: Clear to auscultation bilaterally without wheezes or crackles Cardiovascular: Regular rate and rhythm without murmur gallop or rub  Abdomen: Nontender, nondistended, soft, bowel sounds positive, no rebound, no ascites, no appreciable mass Extremities: No significant cyanosis, clubbing, or edema bilateral lower extremities  Results for orders placed during the hospital encounter of 01/28/14 (from the past 24 hour(s))  GLUCOSE, CAPILLARY     Status: Abnormal   Collection Time    01/29/14  4:58 PM      Result Value Ref Range   Glucose-Capillary 333 (*) 70 - 99 mg/dL   Comment 1 Documented in Chart     Comment 2 Notify RN    GLUCOSE, CAPILLARY     Status: Abnormal   Collection Time    01/29/14  9:26 PM      Result Value Ref Range   Glucose-Capillary 278 (*) 70 - 99 mg/dL  RENAL FUNCTION PANEL     Status: Abnormal     Collection Time    01/30/14  3:32 AM      Result Value Ref Range   Sodium 135 (*) 137 - 147 mEq/L   Potassium 4.7  3.7 - 5.3 mEq/L   Chloride 94 (*) 96 - 112 mEq/L  CO2 29  19 - 32 mEq/L   Glucose, Bld 229 (*) 70 - 99 mg/dL   BUN 68 (*) 6 - 23 mg/dL   Creatinine, Ser 4.54 (*) 0.50 - 1.35 mg/dL   Calcium 8.8  8.4 - 09.8 mg/dL   Phosphorus 3.6  2.3 - 4.6 mg/dL   Albumin 2.6 (*) 3.5 - 5.2 g/dL   GFR calc non Af Amer 35 (*) >90 mL/min   GFR calc Af Amer 41 (*) >90 mL/min  CBC     Status: Abnormal   Collection Time    01/30/14  3:32 AM      Result Value Ref Range   WBC 6.2  4.0 - 10.5 K/uL   RBC 4.01 (*) 4.22 - 5.81 MIL/uL   Hemoglobin 9.6 (*) 13.0 - 17.0 g/dL   HCT 11.9 (*) 14.7 - 82.9 %   MCV 78.6  78.0 - 100.0 fL   MCH 23.9 (*) 26.0 - 34.0 pg   MCHC 30.5  30.0 - 36.0 g/dL   RDW 56.2 (*) 13.0 - 86.5 %   Platelets 218  150 - 400 K/uL  GLUCOSE, CAPILLARY     Status: Abnormal   Collection Time    01/30/14  7:52 AM      Result Value Ref Range   Glucose-Capillary 207 (*) 70 - 99 mg/dL   Comment 1 Documented in Chart     Comment 2 Notify RN      Time spent in discharge (includes decision making & examination of pt): > 30 minutes  01/30/2014, 1:18 PM   Lonia Blood, MD [facility] Office  [phone] Pager [phone]  On-Call/Text Page:      [email or web address]      password [facility]

## 2014-01-31 ENCOUNTER — Ambulatory Visit (INDEPENDENT_AMBULATORY_CARE_PROVIDER_SITE_OTHER): Payer: Medicare HMO

## 2014-01-31 ENCOUNTER — Ambulatory Visit (INDEPENDENT_AMBULATORY_CARE_PROVIDER_SITE_OTHER): Payer: Medicare HMO | Admitting: Podiatry

## 2014-01-31 VITALS — BP 102/62 | HR 89 | Resp 12

## 2014-01-31 DIAGNOSIS — S92309A Fracture of unspecified metatarsal bone(s), unspecified foot, initial encounter for closed fracture: Secondary | ICD-10-CM

## 2014-01-31 NOTE — Progress Notes (Signed)
   Subjective:    Patient ID: Jon Burns, male    DOB: 03-20-1941, 72 y.o.   MRN: [id number]  HPI this patient presents wife for followup evaluation of fracture base fifth right metatarsal. On the visit of 01/21/2014 patient was placed in [employer] boot. At this time he feels that the throbbing and burning has reduced significantly. Also, patient has had ER visit for pulmonary edema and has been discharged.    Review of Systems  Constitutional: Positive for appetite change.  Respiratory: Positive for cough, shortness of breath and wheezing.   Cardiovascular: Positive for leg swelling.  Musculoskeletal: Positive for gait problem.  Skin: Positive for color change.  Neurological: Positive for tremors and numbness.  Hematological: Bruises/bleeds easily.  All other systems reviewed and are negative.      Objective:   Physical Exam Orientated x3 hard of hearing white male presents with wife  Vascular: DP and PT pulses 1/4 bilaterally  Neurological: Sensation to 10 g monofilament wire intact 5/5 right and 2/5 left Vibratory sensation nonreactive right and reactive left  Dermatological: No calf edema right noted Mary minimal nonpitting edema noted right foot No open lesions noted in the right foot No erythema or warmth noted in the right foot  Musculoskeletal: No deformities noted Patient is walking with stable gait pattern  X-ray report weightbearing right foot  Fracture base fifth right metatarsal without displacement  Other than the above noted fracture there are no further additional fractures and or dislocations noted  No emphysema noted  Note increased soft tissue density noted  Radiographic impression:  Fracture base fifth right metatarsal      Assessment & Plan:   Assessment: Fracture base fifth right metatarsal No evidence of other further bony involvement in the right foot Reducing edema right foot  Plan: Patient will continue to wear Cam Walker at at  all times when walking and standing.  Reappoint x4 weeks for repeat progress x-ray right foot

## 2014-01-31 NOTE — Patient Instructions (Signed)
Continue to wear the boot on the right foot when standing walking Limit the amount of standing walking

## 2014-02-01 ENCOUNTER — Encounter: Payer: Self-pay | Admitting: Podiatry

## 2014-02-01 ENCOUNTER — Ambulatory Visit (HOSPITAL_COMMUNITY): Payer: Non-veteran care | Attending: Cardiology | Admitting: Cardiology

## 2014-02-01 DIAGNOSIS — I6529 Occlusion and stenosis of unspecified carotid artery: Secondary | ICD-10-CM | POA: Diagnosis not present

## 2014-02-01 DIAGNOSIS — G458 Other transient cerebral ischemic attacks and related syndromes: Secondary | ICD-10-CM

## 2014-02-01 NOTE — Progress Notes (Signed)
Carotid duplex complete 

## 2014-02-06 ENCOUNTER — Emergency Department (HOSPITAL_COMMUNITY): Payer: Non-veteran care

## 2014-02-06 ENCOUNTER — Inpatient Hospital Stay (HOSPITAL_COMMUNITY)
Admission: EM | Admit: 2014-02-06 | Discharge: 2014-02-11 | DRG: 287 | Disposition: A | Discharge status: Home / Self Care | Payer: Non-veteran care | Attending: Internal Medicine | Admitting: Internal Medicine

## 2014-02-06 ENCOUNTER — Encounter (HOSPITAL_COMMUNITY): Payer: Self-pay | Admitting: Emergency Medicine

## 2014-02-06 DIAGNOSIS — R053 Chronic cough: Secondary | ICD-10-CM

## 2014-02-06 DIAGNOSIS — N400 Enlarged prostate without lower urinary tract symptoms: Secondary | ICD-10-CM

## 2014-02-06 DIAGNOSIS — T451X5A Adverse effect of antineoplastic and immunosuppressive drugs, initial encounter: Secondary | ICD-10-CM | POA: Diagnosis present

## 2014-02-06 DIAGNOSIS — R911 Solitary pulmonary nodule: Secondary | ICD-10-CM

## 2014-02-06 DIAGNOSIS — I509 Heart failure, unspecified: Secondary | ICD-10-CM

## 2014-02-06 DIAGNOSIS — E119 Type 2 diabetes mellitus without complications: Secondary | ICD-10-CM | POA: Diagnosis present

## 2014-02-06 DIAGNOSIS — G4730 Sleep apnea, unspecified: Secondary | ICD-10-CM | POA: Diagnosis present

## 2014-02-06 DIAGNOSIS — K219 Gastro-esophageal reflux disease without esophagitis: Secondary | ICD-10-CM

## 2014-02-06 DIAGNOSIS — Z87891 Personal history of nicotine dependence: Secondary | ICD-10-CM

## 2014-02-06 DIAGNOSIS — J9690 Respiratory failure, unspecified, unspecified whether with hypoxia or hypercapnia: Secondary | ICD-10-CM

## 2014-02-06 DIAGNOSIS — R05 Cough: Secondary | ICD-10-CM

## 2014-02-06 DIAGNOSIS — Z7982 Long term (current) use of aspirin: Secondary | ICD-10-CM

## 2014-02-06 DIAGNOSIS — I501 Left ventricular failure: Secondary | ICD-10-CM

## 2014-02-06 DIAGNOSIS — N184 Chronic kidney disease, stage 4 (severe): Secondary | ICD-10-CM | POA: Diagnosis present

## 2014-02-06 DIAGNOSIS — Z794 Long term (current) use of insulin: Secondary | ICD-10-CM

## 2014-02-06 DIAGNOSIS — Z79899 Other long term (current) drug therapy: Secondary | ICD-10-CM

## 2014-02-06 DIAGNOSIS — E041 Nontoxic single thyroid nodule: Secondary | ICD-10-CM

## 2014-02-06 DIAGNOSIS — R0989 Other specified symptoms and signs involving the circulatory and respiratory systems: Secondary | ICD-10-CM

## 2014-02-06 DIAGNOSIS — E875 Hyperkalemia: Secondary | ICD-10-CM | POA: Diagnosis present

## 2014-02-06 DIAGNOSIS — R0600 Dyspnea, unspecified: Secondary | ICD-10-CM

## 2014-02-06 DIAGNOSIS — E785 Hyperlipidemia, unspecified: Secondary | ICD-10-CM | POA: Diagnosis present

## 2014-02-06 DIAGNOSIS — R0609 Other forms of dyspnea: Secondary | ICD-10-CM | POA: Diagnosis not present

## 2014-02-06 DIAGNOSIS — Z940 Kidney transplant status: Secondary | ICD-10-CM

## 2014-02-06 DIAGNOSIS — I129 Hypertensive chronic kidney disease with stage 1 through stage 4 chronic kidney disease, or unspecified chronic kidney disease: Secondary | ICD-10-CM | POA: Diagnosis present

## 2014-02-06 DIAGNOSIS — Z961 Presence of intraocular lens: Secondary | ICD-10-CM

## 2014-02-06 DIAGNOSIS — I5031 Acute diastolic (congestive) heart failure: Secondary | ICD-10-CM | POA: Diagnosis not present

## 2014-02-06 DIAGNOSIS — N19 Unspecified kidney failure: Secondary | ICD-10-CM | POA: Diagnosis not present

## 2014-02-06 DIAGNOSIS — N189 Chronic kidney disease, unspecified: Secondary | ICD-10-CM

## 2014-02-06 DIAGNOSIS — I10 Essential (primary) hypertension: Secondary | ICD-10-CM

## 2014-02-06 DIAGNOSIS — I5043 Acute on chronic combined systolic (congestive) and diastolic (congestive) heart failure: Principal | ICD-10-CM | POA: Diagnosis present

## 2014-02-06 DIAGNOSIS — Z9849 Cataract extraction status, unspecified eye: Secondary | ICD-10-CM

## 2014-02-06 DIAGNOSIS — M109 Gout, unspecified: Secondary | ICD-10-CM | POA: Diagnosis present

## 2014-02-06 DIAGNOSIS — T50995A Adverse effect of other drugs, medicaments and biological substances, initial encounter: Secondary | ICD-10-CM | POA: Diagnosis present

## 2014-02-06 DIAGNOSIS — N179 Acute kidney failure, unspecified: Secondary | ICD-10-CM | POA: Diagnosis present

## 2014-02-06 DIAGNOSIS — E1029 Type 1 diabetes mellitus with other diabetic kidney complication: Secondary | ICD-10-CM

## 2014-02-06 DIAGNOSIS — B459 Cryptococcosis, unspecified: Secondary | ICD-10-CM | POA: Diagnosis present

## 2014-02-06 DIAGNOSIS — I5021 Acute systolic (congestive) heart failure: Secondary | ICD-10-CM

## 2014-02-06 LAB — BASIC METABOLIC PANEL
BUN: 77 mg/dL — ABNORMAL HIGH (ref 6–23)
CO2: 24 meq/L (ref 19–32)
Calcium: 9.1 mg/dL (ref 8.4–10.5)
Chloride: 91 mEq/L — ABNORMAL LOW (ref 96–112)
Creatinine, Ser: 2.51 mg/dL — ABNORMAL HIGH (ref 0.50–1.35)
GFR calc Af Amer: 28 mL/min — ABNORMAL LOW (ref >90)
GFR calc non Af Amer: 24 mL/min — ABNORMAL LOW (ref >90)
Glucose, Bld: 210 mg/dL — ABNORMAL HIGH (ref 70–99)
Potassium: 5.4 mEq/L — ABNORMAL HIGH (ref 3.7–5.3)
SODIUM: 132 meq/L — AB (ref 137–147)

## 2014-02-06 LAB — CBC
HCT: 33 % — ABNORMAL LOW (ref 39.0–52.0)
Hemoglobin: 9.9 g/dL — ABNORMAL LOW (ref 13.0–17.0)
MCH: 23.5 pg — ABNORMAL LOW (ref 26.0–34.0)
MCHC: 30 g/dL (ref 30.0–36.0)
MCV: 78.4 fL (ref 78.0–100.0)
PLATELETS: 244 10*3/uL (ref 150–400)
RBC: 4.21 MIL/uL — AB (ref 4.22–5.81)
RDW: 17 % — AB (ref 11.5–15.5)
WBC: 9.3 10*3/uL (ref 4.0–10.5)

## 2014-02-06 LAB — GLUCOSE, CAPILLARY
Glucose-Capillary: 207 mg/dL — ABNORMAL HIGH (ref 70–99)
Glucose-Capillary: 346 mg/dL — ABNORMAL HIGH (ref 70–99)
Glucose-Capillary: 391 mg/dL — ABNORMAL HIGH (ref 70–99)

## 2014-02-06 LAB — CBG MONITORING, ED: Glucose-Capillary: 223 mg/dL — ABNORMAL HIGH (ref 70–99)

## 2014-02-06 LAB — I-STAT TROPONIN, ED: TROPONIN I, POC: 0.07 ng/mL (ref 0.00–0.08)

## 2014-02-06 LAB — PRO B NATRIURETIC PEPTIDE: Pro B Natriuretic peptide (BNP): 34741 pg/mL — ABNORMAL HIGH (ref 0–125)

## 2014-02-06 LAB — TSH: TSH: 1.98 u[IU]/mL (ref 0.350–4.500)

## 2014-02-06 LAB — TROPONIN I
Troponin I: <0.3 ng/mL (ref <0.30)
Troponin I: <0.3 ng/mL (ref <0.30)

## 2014-02-06 MED ORDER — SODIUM CHLORIDE 0.9 % IV SOLN: 250.0000 mL | INTRAVENOUS | Status: DC | PRN

## 2014-02-06 MED ORDER — FLUCONAZOLE 200 MG PO TABS
400.0000 mg | ORAL_TABLET | Freq: Every day | ORAL | Status: DC
  Administered 2014-02-07 – 2014-02-11 (×5): 400 mg via ORAL
  Filled 2014-02-06 (×6): qty 2

## 2014-02-06 MED ORDER — TACROLIMUS 0.5 MG PO CAPS
0.5000 mg | ORAL_CAPSULE | Freq: Every morning | ORAL | Status: DC
  Administered 2014-02-07 – 2014-02-09 (×3): 0.5 mg via ORAL
  Filled 2014-02-06 (×4): qty 1

## 2014-02-06 MED ORDER — HEPARIN SODIUM (PORCINE) 5000 UNIT/ML IJ SOLN
5000.0000 [IU] | Freq: Three times a day (TID) | INTRAMUSCULAR | Status: DC
  Administered 2014-02-06 – 2014-02-11 (×14): 5000 [IU] via SUBCUTANEOUS
  Filled 2014-02-06 (×19): qty 1

## 2014-02-06 MED ORDER — ALBUTEROL SULFATE (2.5 MG/3ML) 0.083% IN NEBU: 2.5000 mg | INHALATION_SOLUTION | RESPIRATORY_TRACT | Status: DC | PRN

## 2014-02-06 MED ORDER — PREDNISONE 20 MG PO TABS
40.0000 mg | ORAL_TABLET | Freq: Every day | ORAL | Status: DC
  Administered 2014-02-06 – 2014-02-11 (×6): 40 mg via ORAL
  Filled 2014-02-06 (×8): qty 2

## 2014-02-06 MED ORDER — INSULIN ASPART 100 UNIT/ML ~~LOC~~ SOLN
0.0000 [IU] | Freq: Three times a day (TID) | SUBCUTANEOUS | Status: DC
  Administered 2014-02-06: 7 [IU] via SUBCUTANEOUS
  Administered 2014-02-06: 3 [IU] via SUBCUTANEOUS
  Administered 2014-02-07: 2 [IU] via SUBCUTANEOUS
  Administered 2014-02-07: 5 [IU] via SUBCUTANEOUS
  Administered 2014-02-07: 9 [IU] via SUBCUTANEOUS
  Administered 2014-02-08: 3 [IU] via SUBCUTANEOUS
  Administered 2014-02-08: 5 [IU] via SUBCUTANEOUS
  Administered 2014-02-08: 6 [IU] via SUBCUTANEOUS
  Administered 2014-02-09: 3 [IU] via SUBCUTANEOUS
  Administered 2014-02-09: 5 [IU] via SUBCUTANEOUS
  Administered 2014-02-10: 2 [IU] via SUBCUTANEOUS
  Administered 2014-02-10: 9 [IU] via SUBCUTANEOUS
  Administered 2014-02-11: 1 [IU] via SUBCUTANEOUS
  Administered 2014-02-11 (×2): 5 [IU] via SUBCUTANEOUS

## 2014-02-06 MED ORDER — INSULIN ASPART 100 UNIT/ML ~~LOC~~ SOLN
0.0000 [IU] | Freq: Every day | SUBCUTANEOUS | Status: DC
  Administered 2014-02-06: 5 [IU] via SUBCUTANEOUS
  Administered 2014-02-07: 4 [IU] via SUBCUTANEOUS
  Administered 2014-02-08: 5 [IU] via SUBCUTANEOUS
  Administered 2014-02-10: 2 [IU] via SUBCUTANEOUS

## 2014-02-06 MED ORDER — ASPIRIN EC 81 MG PO TBEC
81.0000 mg | DELAYED_RELEASE_TABLET | ORAL | Status: DC
  Administered 2014-02-07 – 2014-02-09 (×2): 81 mg via ORAL
  Filled 2014-02-06 (×2): qty 1

## 2014-02-06 MED ORDER — ALLOPURINOL 100 MG PO TABS
100.0000 mg | ORAL_TABLET | Freq: Every morning | ORAL | Status: DC
  Administered 2014-02-07 – 2014-02-11 (×5): 100 mg via ORAL
  Filled 2014-02-06 (×5): qty 1

## 2014-02-06 MED ORDER — MAGNESIUM OXIDE 400 (241.3 MG) MG PO TABS
400.0000 mg | ORAL_TABLET | Freq: Every day | ORAL | Status: DC
  Administered 2014-02-07 – 2014-02-11 (×5): 400 mg via ORAL
  Filled 2014-02-06 (×6): qty 1

## 2014-02-06 MED ORDER — HYDROCODONE-ACETAMINOPHEN 10-325 MG PO TABS
1.0000 | ORAL_TABLET | Freq: Four times a day (QID) | ORAL | Status: DC | PRN
  Administered 2014-02-07: 1 via ORAL
  Filled 2014-02-06: qty 1

## 2014-02-06 MED ORDER — SIMVASTATIN 40 MG PO TABS
40.0000 mg | ORAL_TABLET | Freq: Every day | ORAL | Status: DC
  Administered 2014-02-06 – 2014-02-11 (×6): 40 mg via ORAL
  Filled 2014-02-06 (×6): qty 1

## 2014-02-06 MED ORDER — VITAMIN B-12 1000 MCG PO TABS
1000.0000 ug | ORAL_TABLET | Freq: Every day | ORAL | Status: DC
  Administered 2014-02-07 – 2014-02-11 (×5): 1000 ug via ORAL
  Filled 2014-02-06 (×6): qty 1

## 2014-02-06 MED ORDER — SODIUM CHLORIDE 0.9 % IJ SOLN: 3.0000 mL | INTRAMUSCULAR | Status: DC | PRN

## 2014-02-06 MED ORDER — FUROSEMIDE 10 MG/ML IJ SOLN
40.0000 mg | Freq: Two times a day (BID) | INTRAMUSCULAR | Status: DC
  Administered 2014-02-06 – 2014-02-08 (×5): 40 mg via INTRAVENOUS
  Filled 2014-02-06 (×6): qty 4

## 2014-02-06 MED ORDER — TACROLIMUS 0.5 MG PO CAPS: 0.5000 mg | ORAL_CAPSULE | Freq: Two times a day (BID) | ORAL | Status: DC

## 2014-02-06 MED ORDER — PANTOPRAZOLE SODIUM 40 MG PO TBEC
40.0000 mg | DELAYED_RELEASE_TABLET | Freq: Every day | ORAL | Status: DC
  Administered 2014-02-07 – 2014-02-11 (×5): 40 mg via ORAL
  Filled 2014-02-06 (×5): qty 1

## 2014-02-06 MED ORDER — SODIUM CHLORIDE 0.9 % IJ SOLN
3.0000 mL | Freq: Two times a day (BID) | INTRAMUSCULAR | Status: DC
  Administered 2014-02-06 – 2014-02-11 (×11): 3 mL via INTRAVENOUS

## 2014-02-06 MED ORDER — TACROLIMUS 1 MG PO CAPS
1.0000 mg | ORAL_CAPSULE | Freq: Every evening | ORAL | Status: DC
  Administered 2014-02-06 – 2014-02-09 (×4): 1 mg via ORAL
  Filled 2014-02-06 (×5): qty 1

## 2014-02-06 MED ORDER — FUROSEMIDE 10 MG/ML IJ SOLN
40.0000 mg | Freq: Once | INTRAMUSCULAR | Status: AC
  Administered 2014-02-06: 40 mg via INTRAVENOUS
  Filled 2014-02-06: qty 4

## 2014-02-06 MED ORDER — FINASTERIDE 5 MG PO TABS
5.0000 mg | ORAL_TABLET | Freq: Every day | ORAL | Status: DC
  Administered 2014-02-07 – 2014-02-11 (×5): 5 mg via ORAL
  Filled 2014-02-06 (×5): qty 1

## 2014-02-06 MED ORDER — INSULIN NPH (HUMAN) (ISOPHANE) 100 UNIT/ML ~~LOC~~ SUSP
22.0000 [IU] | Freq: Every day | SUBCUTANEOUS | Status: DC
  Administered 2014-02-06: 22 [IU] via SUBCUTANEOUS
  Filled 2014-02-06 (×2): qty 10

## 2014-02-06 MED ORDER — TERAZOSIN HCL 5 MG PO CAPS
10.0000 mg | ORAL_CAPSULE | Freq: Every evening | ORAL | Status: DC
  Administered 2014-02-06 – 2014-02-11 (×6): 10 mg via ORAL
  Filled 2014-02-06 (×6): qty 2

## 2014-02-06 NOTE — ED Provider Notes (Addendum)
CSN: [id number]     Arrival date & time 02/06/14  4098 History   First MD Initiated Contact with Patient 02/06/14 [id number]     Chief Complaint  Patient presents with  . Shortness of Breath     (Consider location/radiation/quality/duration/timing/severity/associated sxs/prior Treatment) HPI 73 y.o. Male with history of chf, s/f renal transplant, pneumonia presents today with worsening sob.  REcords reveal admission last week for chf during which time he was diuresed 5.5 liters.  Wife states he has been gradually getting sob since discharge.  He has gained about 7 pounds on her home scale.  He also has a history of    73 y.o. 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 Patient had recently followed up with his Nephrologist at [facility] and at that time was advised to increase his Lasix to 60 mg by mouth twice a day but hold if blood pressure decreased. Patient increase his dose of Lasix to 60 mg twice a day for 24hrs following which his blood pressure dropped to the 90s. He decreased his Lasix back to his prior dose of 40 mg twice a day. Patient had been gradually getting short of breath since then.   In the ER he was initially placed on CPAP and was given Lasix 80 mg IV. Patient's labs show elevated creatinine from his baseline. Labs done at [facility] 5 days prior noted creatinine around 2.3 v/s 2.8 at presentation.   Hospital Course:  Acute pulmonary edema with history of diastolic CHF  last EF was 50-55% - net negative balance of ~5.3L during his hospital stay - BP tolerated diuresis - renal fxn is improving - returned to prior home lasix tx for d/c - further hx reveals salt indiscretion which is likely the cause of his volume overload - he has been counseled to severely limit salt intake  Acute renal failure in a  transplanted kidney - CKD s/p R renal transplant 2011  Baseline crt reportedly mid 1s per [facility] notes, with recent increase to 2-2.3 - crt steadily improved th/o the hospital stay  Hyperkalemia  Improved with one dose of kayexelate + diuresis - cautioned pt to avoid salt substitutes that often contain KCl   Elevated troponin POC   "True" troponin negative x3 - absolutely no chest pain   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)   Per [facility] notes "to continue on high dose fluconazole through 04/2014, and has f/up with ID in July" - diflucan dose decreased to 200mg  QD while here on advice of Pharmacy based on GFR, but w/ improved GFR will resume his prior dose at time of d/c   Recent right leg fracture   No acute complications   HTN w/ some recent hypotension  BP well controlled   HLD   Sleep apnea   DM2  CBG erratic - advised pt to follow a diabetic diet at home - resume usual regimen at time of d/c    GERD       Medication List     TAKE these medications     Past Medical History  Diagnosis Date  . Hypertension   . CHF (congestive heart failure)   . Hypercholesteremia   . Heart murmur   . Pneumonia 2013  . Shortness of breath     "can come on at anytime lately; sometimes related to his  CHF" (08/27/2013)  . Sleep apnea     "positively has it but never tested"/daughter @ bedside (08/27/2013)  . Type II diabetes mellitus   . GERD (gastroesophageal reflux disease)   . Gout   . 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 Kidney transplant Right 05/14/2010  . Av fistula placement Left July 2011  . Nephrectomy transplanted organ    . Shoulder open rotator cuff repair Right 1990's  . Shoulder arthroscopy w/ rotator cuff repair Left 1990's  . Cataract extraction w/ intraocular lens  implant, bilateral  2014   Family History  Problem Relation Age of Onset  . Cancer Mother     unsure of type   History  Substance Use  Topics  . Smoking status: Former Smoker -- 1.50 packs/day for 40 years    Types: Cigarettes    Quit date: 09/02/1992  . Smokeless tobacco: Never Used  . Alcohol Use: No    Review of Systems    Allergies  Review of patient's allergies indicates no known allergies.  Home Medications   Prior to Admission medications   Medication Sig Start Date End Date Taking? Authorizing Provider  allopurinol (ZYLOPRIM) 100 MG tablet Take 100 mg by mouth every morning.    Historical Provider, MD  aspirin EC 81 MG tablet Take 81 mg by mouth every Monday, Wednesday, and Friday.  08/29/13   Maryann Mikhail, DO  Calcium Carbonate-Vitamin D (CALTRATE 600+D PO) Take 1 tablet by mouth 2 (two) times daily.    Historical Provider, MD  cyanocobalamin 1000 MCG tablet Take 1,000 mcg by mouth daily.    Historical Provider, MD  finasteride (PROSCAR) 5 MG tablet Take 5 mg by mouth daily.    Historical Provider, MD  fluconazole (DIFLUCAN) 200 MG tablet Take 400 mg by mouth daily.     Historical Provider, MD  furosemide (LASIX) 20 MG tablet Take 40 mg by mouth 2 (two) times daily.  08/29/13   Maryann Mikhail, DO  glucagon 1 MG injection Inject 1 mg into the vein once as needed. 11/10/13   Romero Belling, MD  HYDROcodone-acetaminophen (NORCO) 10-325 MG per tablet Take 1 tablet by mouth daily.     Historical Provider, MD  insulin NPH Human (HUMULIN N,NOVOLIN N) 100 UNIT/ML injection Inject 22 Units into the skin at bedtime.    Historical Provider, MD  insulin regular (NOVOLIN R,HUMULIN R) 100 units/mL injection Inject 12 Units into the skin 3 (three) times daily before meals.    Historical Provider, MD  levofloxacin (LEVAQUIN) 250 MG tablet  11/08/13   Historical Provider, MD  magnesium oxide (MAG-OX) 400 MG tablet Take 400 mg by mouth daily.    Historical Provider, MD  omeprazole (PRILOSEC) 20 MG capsule Take 20 mg by mouth daily.    Historical Provider, MD  predniSONE (DELTASONE) 5 MG tablet Take 2.5-5 mg by mouth 2 (two)  times daily with a meal. *takes 5mg  in the morning and 2.5mg  at night*    Historical Provider, MD  tacrolimus (PROGRAF) 1 MG capsule Take 1 mg by mouth 2 (two) times daily.     Historical Provider, MD  TAMIFLU 30 MG capsule  11/08/13   Historical Provider, MD  terazosin (HYTRIN) 10 MG capsule Take 10 mg by mouth every evening.     Historical Provider, MD  traMADol Janean Sark) 50 MG tablet  01/14/14   Historical Provider, MD   BP 123/84  Pulse  103  Temp(Src) 98 F (36.7 C) (Oral)  Resp 24  Ht 6\' 1"  (1.854 m)  Wt 208 lb (94.348 kg)  BMI 27.45 kg/m2  SpO2 96% Physical Exam  Nursing note and vitals reviewed. Constitutional: He is oriented to person, place, and time. He appears well-developed and well-nourished.  HENT:  Head: Normocephalic and atraumatic.  Nose: Nose normal.  Mouth/Throat: Oropharynx is clear and moist.  Eyes: Conjunctivae are normal. Pupils are equal, round, and reactive to light.  Neck: Normal range of motion. Neck supple.  Cardiovascular: Tachycardia present.   Pulmonary/Chest: Effort normal and breath sounds normal.  tachypneic but no wheezing or rales, some crackles bilateral lower lobes  Abdominal: Soft. Bowel sounds are normal.  Musculoskeletal: Normal range of motion.  Trace edema  Neurological: He is alert and oriented to person, place, and time. He has normal reflexes. He displays normal reflexes. No cranial nerve deficit. Coordination normal.  Skin: Skin is warm and dry.  Some yellowish cast  Psychiatric: He has a normal mood and affect. His behavior is normal. Judgment and thought content normal.    ED Course  Procedures (including critical care time) Labs Review Labs Reviewed  CBC - Abnormal; Notable for the following:    RBC 4.21 (*)    Hemoglobin 9.9 (*)    HCT 33.0 (*)    MCH 23.5 (*)    RDW 17.0 (*)    All other components within normal limits  BASIC METABOLIC PANEL - Abnormal; Notable for the following:    Sodium 132 (*)    Potassium 5.4 (*)     Chloride 91 (*)    Glucose, Bld 210 (*)    BUN 77 (*)    Creatinine, Ser 2.51 (*)    GFR calc non Af Amer 24 (*)    GFR calc Af Amer 28 (*)    All other components within normal limits  PRO B NATRIURETIC PEPTIDE - Abnormal; Notable for the following:    Pro B Natriuretic peptide (BNP) 34741.0 (*)    All other components within normal limits  CBG MONITORING, ED - Abnormal; Notable for the following:    Glucose-Capillary 223 (*)    All other components within normal limits  I-STAT TROPOININ, ED    Imaging Review No results found.   EKG Interpretation   Date/Time:  Sunday February 06 2014 09:41:58 EDT Ventricular Rate:  98 PR Interval:  238 QRS Duration: 118 QT Interval:  356 QTC Calculation: 454 R Axis:   11 Text Interpretation:  Sinus rhythm with 1st degree A-V block Non-specific  ST-t changes Confirmed by Malikiah Debarr MD, Duwayne Heck ([id number]) on 02/06/2014 12:20:19 PM      MDM   Final diagnoses:  None    Patient with dyspnea with recent history of admission for chf exacerbation with increased marking on cxr and bnp elevated ath 34,741.  Patient reports taking meds as prescribed and is on diflucan for pulmonary crypto. He has also had recent surgery but denies lateralized leg swelling and had negative dopplers on  5.29 He has had a kidney transplant with baseline creatinine- today elevated at 2.51. Discharge creatinine 5/31 1.81  1- chf- patient with continued increased markings on cxr and bnp elevated.  Patient taking lasix at home but continues to appear somewhat volume overloaded. 2- renal failure- patient with elevated creatinine at 2.53 up from baseline on discharge of 1.8- patient has had a renal transplant at [facility] 3-pulmonary cryptococcus on diflucan through August   Abygail Galeno S Eilidh Marcano,  MD 02/06/14 1215  Hilario Quarryanielle S Hoby Kawai, MD 02/07/14 [phone]

## 2014-02-06 NOTE — ED Notes (Signed)
Hes felt SOB since yesterday am. He was admitted last week for SOB. He denies pain. States he feels very SOB with exertion

## 2014-02-06 NOTE — H&P (Signed)
[facility] History and Physical  Tahmir Kleckner ZOX:[id number] DOB: [birth year] DOA: 02/06/2014  Referring physician: er PCP: Marin Comment, FNP   Chief Complaint: SOB  HPI: Jon Burns is a 73 y.o. male  Who was discharged from the hospital 5/31 for CHF.  Notes over the last week he has become increasingly SOB.     Review of Systems:  All systems reviewed, negative unless stated above .   Past Medical History  Diagnosis Date  . Hypertension   . CHF (congestive heart failure)   . Hypercholesteremia   . Heart murmur   . Pneumonia 2013  . Shortness of breath     "can come on at anytime lately; sometimes related to his CHF" (08/27/2013)  . Sleep apnea     "positively has it but never tested"/daughter @ bedside (08/27/2013)  . Type II diabetes mellitus   . GERD (gastroesophageal reflux disease)   . Gout   . 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 Kidney transplant Right 05/14/2010  . Av fistula placement Left July 2011  . Nephrectomy transplanted organ    . Shoulder open rotator cuff repair Right 1990's  . Shoulder arthroscopy w/ rotator cuff repair Left 1990's  . Cataract extraction w/ intraocular lens  implant, bilateral  2014   Social History:  reports that he quit smoking about 21 years ago. His smoking use included Cigarettes. He has a 60 pack-year smoking history. He has never used smokeless tobacco. He reports that he does not drink alcohol or use illicit drugs.  No Known Allergies  Family History  Problem Relation Age of Onset  . Cancer Mother     unsure of type     Prior to Admission medications   Medication Sig Start Date End Date Taking? Authorizing Provider  allopurinol (ZYLOPRIM) 100 MG tablet Take 100 mg by mouth every morning.   Yes Historical Provider, MD  aspirin EC 81 MG tablet Take 81 mg by mouth every Monday, Wednesday, and Friday.  08/29/13  Yes Maryann Mikhail, DO  Calcium  Carbonate-Vitamin D (CALTRATE 600+D PO) Take 1 tablet by mouth 2 (two) times daily.   Yes Historical Provider, MD  cyanocobalamin 1000 MCG tablet Take 1,000 mcg by mouth daily.   Yes Historical Provider, MD  finasteride (PROSCAR) 5 MG tablet Take 5 mg by mouth daily.   Yes Historical Provider, MD  fluconazole (DIFLUCAN) 200 MG tablet Take 400 mg by mouth daily.    Yes Historical Provider, MD  furosemide (LASIX) 20 MG tablet Take 40 mg by mouth 2 (two) times daily.  08/29/13  Yes Maryann Mikhail, DO  glucagon 1 MG injection Inject 1 mg into the vein once as needed. 11/10/13  Yes Romero Belling, MD  HYDROcodone-acetaminophen (NORCO) 10-325 MG per tablet Take 1 tablet by mouth every 6 (six) hours as needed for moderate pain.    Yes Historical Provider, MD  insulin NPH Human (HUMULIN N,NOVOLIN N) 100 UNIT/ML injection Inject 22 Units into the skin at bedtime.   Yes Historical Provider, MD  insulin regular (NOVOLIN R,HUMULIN R) 100 units/mL injection Inject 12 Units into the skin 3 (three) times daily before meals.   Yes Historical Provider, MD  magnesium oxide (MAG-OX) 400 MG tablet Take 400 mg by mouth daily.   Yes Historical Provider, MD  omeprazole (PRILOSEC) 20 MG capsule Take 20 mg by mouth daily.   Yes Historical Provider, MD  pravastatin (PRAVACHOL)  80 MG tablet Take 80 mg by mouth daily.   Yes Historical Provider, MD  predniSONE (DELTASONE) 5 MG tablet Take 2.5-5 mg by mouth 2 (two) times daily. Take 5 mg in the morning and 2.5 mg at bedtime   Yes Historical Provider, MD  tacrolimus (PROGRAF) 0.5 MG capsule Take 0.5-1 mg by mouth 2 (two) times daily. Take 0.5 mg in the am and 1 mg in the evening   Yes Historical Provider, MD  terazosin (HYTRIN) 10 MG capsule Take 10 mg by mouth every evening.    Yes Historical Provider, MD  traMADol (ULTRAM) 50 MG tablet Take by mouth every 6 (six) hours as needed for moderate pain.  01/14/14  Yes Historical Provider, MD   Physical Exam: Filed Vitals:   02/06/14  1300  BP: 125/84  Pulse: 98  Temp:   Resp: 19    BP 125/84  Pulse 98  Temp(Src) 98 F (36.7 C) (Oral)  Resp 19  Ht 6\' 1"  (1.854 m)  Wt 94.348 kg (208 lb)  BMI 27.45 kg/m2  SpO2 98%  General:  Appears calm and comfortable Eyes: PERRL, normal lids, irises & conjunctiva ENT: grossly normal hearing, lips & tongue Neck: no LAD, masses or thyromegaly Cardiovascular: RRR, no m/r/g. No LE edema. Telemetry: SR, no arrhythmias  Respiratory: CTA bilaterally, no w/r/r. Normal respiratory effort. Abdomen: soft, ntnd Skin: no rash or induration seen on limited exam Musculoskeletal: grossly normal tone BUE/BLE Psychiatric: grossly normal mood and affect, speech fluent and appropriate Neurologic: grossly non-focal.          Labs on Admission:  Basic Metabolic Panel:  Recent Labs Lab 02/06/14 1005  NA 132*  K 5.4*  CL 91*  CO2 24  GLUCOSE 210*  BUN 77*  CREATININE 2.51*  CALCIUM 9.1   Liver Function Tests: No results found for this basename: AST, ALT, ALKPHOS, BILITOT, PROT, ALBUMIN,  in the last 168 hours No results found for this basename: LIPASE, AMYLASE,  in the last 168 hours No results found for this basename: AMMONIA,  in the last 168 hours CBC:  Recent Labs Lab 02/06/14 1005  WBC 9.3  HGB 9.9*  HCT 33.0*  MCV 78.4  PLT 244   Cardiac Enzymes: No results found for this basename: CKTOTAL, CKMB, CKMBINDEX, TROPONINI,  in the last 168 hours  BNP (last 3 results)  Recent Labs  08/26/13 0452 01/28/14 0241 02/06/14 1005  PROBNP 3270.0* 33416.0* 34741.0*   CBG:  Recent Labs Lab 02/06/14 1100  GLUCAP 223*    Radiological Exams on Admission: Dg Chest 2 View  02/06/2014   CLINICAL DATA:  Shortness of breath.  EXAM: CHEST  2 VIEW  COMPARISON:  01/28/2014  FINDINGS: Lungs are adequately inflated with continued prominence of the perihilar/ bibasilar markings likely representing mild interstitial edema. Possible small amount of right pleural fluid.  Cardiomediastinal silhouette and remainder the exam is unchanged.  IMPRESSION: Stable findings suggesting mild CHF. Possible small amount of right pleural fluid.   Electronically Signed   By: Elberta Fortisaniel  Boyle M.D.   On: 02/06/2014 11:03    EKG: Independently reviewed. Sinus with 1 deg AV block  Assessment/Plan Active Problems:   Acute diastolic heart failure   History of renal transplantation   Dyspnea   Acute renal failure   CHF (congestive heart failure)   SOB- ? etiology most likely CHF as weight increase but patient is not moving much air.  Seen by Dr. Sherene SiresWert 2014 with normal PFTs and HE DOES  NOT HAVE COPD IV lasix, daily weight, strict I/Os -steroids- wean done to HOME DOSE -echo- cards consult once echo back - follows with Dr. 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 Similar sounding words can inadvertently be transcribed and this note may contain transcription errors which may not have been corrected upon publication of note.**

## 2014-02-06 NOTE — ED Notes (Signed)
Patient transported to X-ray 

## 2014-02-07 DIAGNOSIS — N179 Acute kidney failure, unspecified: Secondary | ICD-10-CM | POA: Diagnosis not present

## 2014-02-07 DIAGNOSIS — I509 Heart failure, unspecified: Secondary | ICD-10-CM | POA: Diagnosis not present

## 2014-02-07 DIAGNOSIS — K219 Gastro-esophageal reflux disease without esophagitis: Secondary | ICD-10-CM | POA: Diagnosis not present

## 2014-02-07 DIAGNOSIS — I517 Cardiomegaly: Secondary | ICD-10-CM

## 2014-02-07 DIAGNOSIS — N400 Enlarged prostate without lower urinary tract symptoms: Secondary | ICD-10-CM | POA: Diagnosis not present

## 2014-02-07 LAB — BASIC METABOLIC PANEL
BUN: 82 mg/dL — ABNORMAL HIGH (ref 6–23)
CHLORIDE: 93 meq/L — AB (ref 96–112)
CO2: 27 mEq/L (ref 19–32)
Calcium: 8.8 mg/dL (ref 8.4–10.5)
Creatinine, Ser: 2.42 mg/dL — ABNORMAL HIGH (ref 0.50–1.35)
GFR, EST AFRICAN AMERICAN: 29 mL/min — AB (ref >90)
GFR, EST NON AFRICAN AMERICAN: 25 mL/min — AB (ref >90)
Glucose, Bld: 327 mg/dL — ABNORMAL HIGH (ref 70–99)
POTASSIUM: 5.6 meq/L — AB (ref 3.7–5.3)
SODIUM: 133 meq/L — AB (ref 137–147)

## 2014-02-07 LAB — GLUCOSE, CAPILLARY
GLUCOSE-CAPILLARY: 236 mg/dL — AB (ref 70–99)
GLUCOSE-CAPILLARY: 299 mg/dL — AB (ref 70–99)
Glucose-Capillary: 434 mg/dL — ABNORMAL HIGH (ref 70–99)
Glucose-Capillary: 443 mg/dL — ABNORMAL HIGH (ref 70–99)
Glucose-Capillary: 349 mg/dL — ABNORMAL HIGH (ref 70–99)

## 2014-02-07 LAB — HEMOGLOBIN A1C
HEMOGLOBIN A1C: 7.6 % — AB (ref <5.7)
Mean Plasma Glucose: 171 mg/dL — ABNORMAL HIGH (ref <117)

## 2014-02-07 LAB — TSH: TSH: 1.39 u[IU]/mL (ref 0.350–4.500)

## 2014-02-07 LAB — TROPONIN I

## 2014-02-07 MED ORDER — INSULIN NPH (HUMAN) (ISOPHANE) 100 UNIT/ML ~~LOC~~ SUSP
16.0000 [IU] | Freq: Two times a day (BID) | SUBCUTANEOUS | Status: DC
  Administered 2014-02-07 – 2014-02-11 (×8): 16 [IU] via SUBCUTANEOUS
  Filled 2014-02-07 (×2): qty 10

## 2014-02-07 MED ORDER — INSULIN NPH (HUMAN) (ISOPHANE) 100 UNIT/ML ~~LOC~~ SUSP
16.0000 [IU] | Freq: Two times a day (BID) | SUBCUTANEOUS | Status: DC
  Filled 2014-02-07: qty 10

## 2014-02-07 NOTE — Progress Notes (Signed)
Echocardiogram 2D Echocardiogram has been performed.  Jon Burns 02/07/2014, 3:24 PM

## 2014-02-07 NOTE — Progress Notes (Signed)
Pt's BS 443 and rechecked 434 mg/dl.  Pt  Asymptomatic.  Dr. Wilhemena Durie notified for some insulin coverage.  Will continue to monitor.  Amanda Pea, Charity fundraiser.

## 2014-02-07 NOTE — Progress Notes (Signed)
UR completed Olson Lucarelli K. Adessa Primiano, RN, BSN, MSHL, CCM  02/07/2014 5:56 PM

## 2014-02-07 NOTE — Progress Notes (Signed)
[facility] PROGRESS NOTE Interim History: 73 y/o male with hx of diastolic heart failure, discharged on 5/31 after admission for CHF exacerbation at that time; has noticed increased SOB and LE swelling.  Filed Weights   02/06/14 0948 02/06/14 1401 02/07/14 0617  Weight: 94.348 kg (208 lb) 90.08 kg (198 lb 9.5 oz) 89.5 kg (197 lb 5 oz)        Intake/Output Summary (Last 24 hours) at 02/07/14 1519 Last data filed at 02/07/14 1300  Gross per 24 hour  Intake   1060 ml  Output   2200 ml  Net  -1140 ml     Assessment/Plan: 1-acute on chronic diastolic CHF (congestive heart failure): most likely. Last 2-D echo with EF 50-55% on 08/2013.  -breathing better today -continue IV lasix X 1 more day -daily weight, low sodium diet, strict intake and output  2-Acute on chronic renal failure: patient with mild elevation of his renal function  -most likely secondary to CHF exacerbation  -renal has been consulted -will check tacrolimus level -Cr better today after lasix  3-History of renal transplantation: follow renal recommendations -continue tacrolimus -continue prednisone  4-diabetes: -A1C 7.6 -continue NPH BID now for better control  5-GERD: continue PPI  6-HLD: continue statins  7-BPH: continue hytrin    Code Status: full Family Communication: wife at bedside  Disposition Plan: to be determine    Consultants:  Renal service   Procedures: ECHO: pending  Antibiotics:  None   HPI/Subjective: Afebrile; denies CP. Breathing better.  Objective: Filed Vitals:   02/06/14 1300 02/06/14 1401 02/06/14 1945 02/07/14 0617  BP: 125/84 128/83 113/78 147/86  Pulse: 98 91 92 95  Temp:  98 F (36.7 C) 98.1 F (36.7 C) 97.8 F (36.6 C)  TempSrc:  Oral Oral Oral  Resp: 19 18 19 24   Height:  6\' 1"  (1.854 m)    Weight:  90.08 kg (198 lb 9.5 oz)  89.5 kg (197 lb 5 oz)  SpO2: 98% 100% 100% 100%     Exam:  General: Alert, awake, oriented x3; feeling better  and breathing better today HEENT: No bruits, no goiter; no JVD Heart: Regular rate and rhythm, no rubs or gallops; trace edema Lungs: Good air movement, fine crackles at bases Abdomen: Soft, nontender, nondistended, positive bowel sounds.  Neuro: Grossly intact, nonfocal.   Data Reviewed: Basic Metabolic Panel:  Recent Labs Lab 02/06/14 1005 02/07/14 0124  NA 132* 133*  K 5.4* 5.6*  CL 91* 93*  CO2 24 27  GLUCOSE 210* 327*  BUN 77* 82*  CREATININE 2.51* 2.42*  CALCIUM 9.1 8.8   CBC:  Recent Labs Lab 02/06/14 1005  WBC 9.3  HGB 9.9*  HCT 33.0*  MCV 78.4  PLT 244   Cardiac Enzymes:  Recent Labs Lab 02/06/14 1413 02/06/14 2005 02/07/14 0124  TROPONINI <0.30 <0.30 <0.30   BNP (last 3 results)  Recent Labs  08/26/13 0452 01/28/14 0241 02/06/14 1005  PROBNP 3270.0* 33416.0* 34741.0*   CBG:  Recent Labs Lab 02/06/14 1358 02/06/14 1718 02/06/14 2053 02/07/14 0611 02/07/14 1105  GLUCAP 207* 346* 391* 236* 299*    Studies: Dg Chest 2 View  02/06/2014   CLINICAL DATA:  Shortness of breath.  EXAM: CHEST  2 VIEW  COMPARISON:  01/28/2014  FINDINGS: Lungs are adequately inflated with continued prominence of the perihilar/ bibasilar markings likely representing mild interstitial edema. Possible small amount of right pleural fluid. Cardiomediastinal silhouette and remainder the exam is unchanged.  IMPRESSION: Stable  findings suggesting mild CHF. Possible small amount of right pleural fluid.   Electronically Signed   By: Elberta Fortis M.D.   On: 02/06/2014 11:03    Scheduled Meds: . allopurinol  100 mg Oral q morning - 10a  . aspirin EC  81 mg Oral Q M,W,F  . finasteride  5 mg Oral Daily  . fluconazole  400 mg Oral Daily  . furosemide  40 mg Intravenous BID  . heparin  5,000 Units Subcutaneous 3 times per day  . insulin aspart  0-5 Units Subcutaneous QHS  . insulin aspart  0-9 Units Subcutaneous TID WC  . insulin NPH Human  16 Units Subcutaneous BID AC & HS   . magnesium oxide  400 mg Oral Daily  . pantoprazole  40 mg Oral Daily  . predniSONE  40 mg Oral Q breakfast  . simvastatin  40 mg Oral q1800  . sodium chloride  3 mL Intravenous Q12H  . tacrolimus  0.5 mg Oral q morning - 10a   And  . tacrolimus  1 mg Oral QPM  . terazosin  10 mg Oral QPM  . cyanocobalamin  1,000 mcg Oral Daily   Continuous Infusions:   Time: >30 minutes  Vassie Loll  [facility] Pager [phone]. If 8PM-8AM, please contact night-coverage at [email or web address], password [facility] 02/07/2014, 3:19 PM  LOS: 1 day     **Disclaimer: This note may have been dictated with voice recognition software. Similar sounding words can inadvertently be transcribed and this note may contain transcription errors which may not have been corrected upon publication of note.**

## 2014-02-07 NOTE — Progress Notes (Signed)
Dr Gwenlyn Perking returned call regarding pt's BS 434mg /dl.  Insrtucted to give pt 9units novolog  last dose in the SSI  as ordered  and pharmacy to  changed NPH to BID.  Pharmacist called and notified of order.  Will continue to monitor.  Owenn Rothermel, Charity fundraiser.

## 2014-02-07 NOTE — Progress Notes (Signed)
73 y.o. 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  (Baseline creatinines include : 1.8 on 02/02/13, 1.7 on 11/16/13, 1.3 on 11/17/13 and 2.2 on 12/20/13.) With diuresis creatinine went from 2.89 on admission to 1.85 on 5/31.  The pt was readmitted yesterday with recurrent CHF and we are asked to see due to rising creatinine..  Results for Jon Burns, Jon Burns (MRN [id number]) as of 02/07/2014 17:41  Ref. Range 10/28/2013 13:58 01/28/2014 02:41 01/28/2014 09:05 01/29/2014 02:39 01/30/2014 03:32 02/06/2014 10:05 02/06/2014 14:13 02/07/2014 01:24  BUN Latest Range: 6-23 mg/dL 37 (H) 70 (H) 70 (H) 75 (H) 68 (H) 77 (H)  82 (H)  Creatinine Latest Range: 0.50-1.35 mg/dL 1.38 (H) 2.89 (H) 2.74 (H) 2.40 (H) 1.85 (H) 2.51 (H)  2.42 (H)   Past Medical History  Diagnosis Date  . Hypertension   . CHF (congestive heart failure)   . Hypercholesteremia   . Heart murmur   . Pneumonia 2013  . Shortness of breath     "can come on at anytime lately; sometimes related to his CHF" (08/27/2013)  . Sleep apnea     "positively has it but never tested"/daughter @ bedside (08/27/2013)  . Type II diabetes mellitus   . GERD (gastroesophageal reflux disease)   . Gout   . 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 Kidney transplant Right 05/14/2010  . Av fistula placement Left July 2011  . Nephrectomy transplanted organ    . Shoulder open rotator cuff repair Right 1990's  . Shoulder arthroscopy w/ rotator cuff repair Left 1990's  . Cataract extraction w/ intraocular lens  implant, bilateral  2014   Social History:  reports that he quit smoking about 21 years ago. His smoking use included Cigarettes. He has a 60 pack-year smoking  history. He has never used smokeless tobacco. He reports that he does not drink alcohol or use illicit drugs. Allergies: No Known Allergies Family History  Problem Relation Age of Onset  . Cancer Mother     unsure of type    Medications:  Prior to Admission:  Prescriptions prior to admission  Medication Sig Dispense Refill  . allopurinol (ZYLOPRIM) 100 MG tablet Take 100 mg by mouth every morning.      Marland Kitchen aspirin EC 81 MG tablet Take 81 mg by mouth every Monday, Wednesday, and Friday.       . Calcium Carbonate-Vitamin D (CALTRATE 600+D PO) Take 1 tablet by mouth 2 (two) times daily.      . cyanocobalamin 1000 MCG tablet Take 1,000 mcg by mouth daily.      . finasteride (PROSCAR) 5 MG tablet Take 5 mg by mouth daily.      . fluconazole (DIFLUCAN) 200 MG tablet Take 400 mg by mouth daily.       . furosemide (LASIX) 20 MG tablet Take 40 mg by mouth 2 (two) times daily.       Marland Kitchen glucagon 1 MG injection Inject 1 mg into the vein once as needed.  1 each  12  . HYDROcodone-acetaminophen (NORCO) 10-325 MG per tablet Take 1 tablet by mouth every 6 (six) hours as needed for moderate pain.       Marland Kitchen insulin NPH Human (HUMULIN N,NOVOLIN N) 100 UNIT/ML  injection Inject 22 Units into the skin at bedtime.      . insulin regular (NOVOLIN R,HUMULIN R) 100 units/mL injection Inject 12 Units into the skin 3 (three) times daily before meals.      . magnesium oxide (MAG-OX) 400 MG tablet Take 400 mg by mouth daily.      Marland Kitchen omeprazole (PRILOSEC) 20 MG capsule Take 20 mg by mouth daily.      . pravastatin (PRAVACHOL) 80 MG tablet Take 80 mg by mouth daily.      . predniSONE (DELTASONE) 5 MG tablet Take 2.5-5 mg by mouth 2 (two) times daily. Take 5 mg in the morning and 2.5 mg at bedtime      . tacrolimus (PROGRAF) 0.5 MG capsule Take 0.5-1 mg by mouth 2 (two) times daily. Take 0.5 mg in the am and 1 mg in the evening      . terazosin (HYTRIN) 10 MG capsule Take 10 mg by mouth every evening.       . traMADol  (ULTRAM) 50 MG tablet Take by mouth every 6 (six) hours as needed for moderate pain.        Scheduled: . allopurinol  100 mg Oral q morning - 10a  . aspirin EC  81 mg Oral Q M,W,F  . finasteride  5 mg Oral Daily  . fluconazole  400 mg Oral Daily  . furosemide  40 mg Intravenous BID  . heparin  5,000 Units Subcutaneous 3 times per day  . insulin aspart  0-5 Units Subcutaneous QHS  . insulin aspart  0-9 Units Subcutaneous TID WC  . insulin NPH Human  16 Units Subcutaneous BID AC & HS  . magnesium oxide  400 mg Oral Daily  . pantoprazole  40 mg Oral Daily  . predniSONE  40 mg Oral Q breakfast  . simvastatin  40 mg Oral q1800  . sodium chloride  3 mL Intravenous Q12H  . tacrolimus  0.5 mg Oral q morning - 10a   And  . tacrolimus  1 mg Oral QPM  . terazosin  10 mg Oral QPM  . cyanocobalamin  1,000 mcg Oral Daily    ROS: noncontributory  Blood pressure 142/82, pulse 95, temperature 97.8 F (36.6 C), temperature source Oral, resp. rate 24, height 6\' 1"  (1.854 m), weight 89.5 kg (197 lb 5 oz), SpO2 100.00%.  General appearance: alert and cooperative Head: Normocephalic, without obvious abnormality, atraumatic Throat: lips, mucosa, and tongue normal; teeth and gums normal Resp: clear to auscultation bilaterally Chest wall: no tenderness Cardio: regular rate and rhythm, S1, S2 normal, no murmur, click, rub or gallop GI: soft, non-tender; bowel sounds normal; no masses,  no organomegaly and 1+ presacral edema Extremities: edema 1+ Skin: Skin color, texture, turgor normal. No rashes or lesions Neurologic: Grossly normal Results for orders placed during the hospital encounter of 02/06/14 (from the past 48 hour(s))  CBC     Status: Abnormal   Collection Time    02/06/14 10:05 AM      Result Value Ref Range   WBC 9.3  4.0 - 10.5 K/uL   RBC 4.21 (*) 4.22 - 5.81 MIL/uL   Hemoglobin 9.9 (*) 13.0 - 17.0 g/dL   HCT 04/08/14 (*) 92.7 - 63.9 %   MCV 78.4  78.0 - 100.0 fL   MCH 23.5 (*) 26.0 -  34.0 pg   MCHC 30.0  30.0 - 36.0 g/dL   RDW 43.2 (*) 00.3 - 79.4 %   Platelets 244  150 - 400 K/uL  BASIC METABOLIC PANEL     Status: Abnormal   Collection Time    02/06/14 10:05 AM      Result Value Ref Range   Sodium 132 (*) 137 - 147 mEq/L   Potassium 5.4 (*) 3.7 - 5.3 mEq/L   Chloride 91 (*) 96 - 112 mEq/L   CO2 24  19 - 32 mEq/L   Glucose, Bld 210 (*) 70 - 99 mg/dL   BUN 77 (*) 6 - 23 mg/dL   Creatinine, Ser 2.51 (*) 0.50 - 1.35 mg/dL   Calcium 9.1  8.4 - 10.5 mg/dL   GFR calc non Af Amer 24 (*) >90 mL/min   GFR calc Af Amer 28 (*) >90 mL/min   Comment: (NOTE)     The eGFR has been calculated using the CKD EPI equation.     This calculation has not been validated in all clinical situations.     eGFR's persistently <90 mL/min signify possible Chronic Kidney     Disease.  PRO B NATRIURETIC PEPTIDE     Status: Abnormal   Collection Time    02/06/14 10:05 AM      Result Value Ref Range   Pro B Natriuretic peptide (BNP) 34741.0 (*) 0 - 125 pg/mL  I-STAT TROPOININ, ED     Status: None   Collection Time    02/06/14 10:32 AM      Result Value Ref Range   Troponin i, poc 0.07  0.00 - 0.08 ng/mL   Comment 3            Comment: Due to the release kinetics of cTnI,     a negative result within the first hours     of the onset of symptoms does not rule out     myocardial infarction with certainty.     If myocardial infarction is still suspected,     repeat the test at appropriate intervals.  CBG MONITORING, ED     Status: Abnormal   Collection Time    02/06/14 11:00 AM      Result Value Ref Range   Glucose-Capillary 223 (*) 70 - 99 mg/dL  GLUCOSE, CAPILLARY     Status: Abnormal   Collection Time    02/06/14  1:58 PM      Result Value Ref Range   Glucose-Capillary 207 (*) 70 - 99 mg/dL  HEMOGLOBIN A1C     Status: Abnormal   Collection Time    02/06/14  2:13 PM      Result Value Ref Range   Hemoglobin A1C 7.6 (*) <5.7 %   Comment: (NOTE)                                                                                According to the ADA Clinical Practice Recommendations for 2011, when     HbA1c is used as a screening test:      >=6.5%   Diagnostic of Diabetes Mellitus               (if abnormal result is confirmed)     5.7-6.4%   Increased risk of developing Diabetes Mellitus  References:Diagnosis and Classification of Diabetes Mellitus,Diabetes     ZOXW,9604,54(UJWJX 1):S62-S69 and Standards of Medical Care in             Diabetes - 2011,Diabetes BJYN,8295,62 (Suppl 1):S11-S61.   Mean Plasma Glucose 171 (*) <117 mg/dL   Comment: Performed at [facility]  TROPONIN I     Status: None   Collection Time    02/06/14  2:13 PM      Result Value Ref Range   Troponin I <0.30  <0.30 ng/mL   Comment:            Due to the release kinetics of cTnI,     a negative result within the first hours     of the onset of symptoms does not rule out     myocardial infarction with certainty.     If myocardial infarction is still suspected,     repeat the test at appropriate intervals.  TSH     Status: None   Collection Time    02/06/14  2:13 PM      Result Value Ref Range   TSH 1.980  0.350 - 4.500 uIU/mL  GLUCOSE, CAPILLARY     Status: Abnormal   Collection Time    02/06/14  5:18 PM      Result Value Ref Range   Glucose-Capillary 346 (*) 70 - 99 mg/dL   Comment 1 Documented in Chart     Comment 2 Notify RN    TROPONIN I     Status: None   Collection Time    02/06/14  8:05 PM      Result Value Ref Range   Troponin I <0.30  <0.30 ng/mL   Comment:            Due to the release kinetics of cTnI,     a negative result within the first hours     of the onset of symptoms does not rule out     myocardial infarction with certainty.     If myocardial infarction is still suspected,     repeat the test at appropriate intervals.  GLUCOSE, CAPILLARY     Status: Abnormal   Collection Time    02/06/14  8:53 PM      Result Value Ref Range   Glucose-Capillary  391 (*) 70 - 99 mg/dL   Comment 1 Notify RN    TROPONIN I     Status: None   Collection Time    02/07/14  1:24 AM      Result Value Ref Range   Troponin I <0.30  <0.30 ng/mL   Comment:            Due to the release kinetics of cTnI,     a negative result within the first hours     of the onset of symptoms does not rule out     myocardial infarction with certainty.     If myocardial infarction is still suspected,     repeat the test at appropriate intervals.  BASIC METABOLIC PANEL     Status: Abnormal   Collection Time    02/07/14  1:24 AM      Result Value Ref Range   Sodium 133 (*) 137 - 147 mEq/L   Potassium 5.6 (*) 3.7 - 5.3 mEq/L   Chloride 93 (*) 96 - 112 mEq/L   CO2 27  19 - 32 mEq/L   Glucose, Bld 327 (*) 70 - 99 mg/dL  BUN 82 (*) 6 - 23 mg/dL   Creatinine, Ser 2.42 (*) 0.50 - 1.35 mg/dL   Calcium 8.8  8.4 - 10.5 mg/dL   GFR calc non Af Amer 25 (*) >90 mL/min   GFR calc Af Amer 29 (*) >90 mL/min   Comment: (NOTE)     The eGFR has been calculated using the CKD EPI equation.     This calculation has not been validated in all clinical situations.     eGFR's persistently <90 mL/min signify possible Chronic Kidney     Disease.  TSH     Status: None   Collection Time    02/07/14  1:24 AM      Result Value Ref Range   TSH 1.390  0.350 - 4.500 uIU/mL  GLUCOSE, CAPILLARY     Status: Abnormal   Collection Time    02/07/14  6:11 AM      Result Value Ref Range   Glucose-Capillary 236 (*) 70 - 99 mg/dL  GLUCOSE, CAPILLARY     Status: Abnormal   Collection Time    02/07/14 11:05 AM      Result Value Ref Range   Glucose-Capillary 299 (*) 70 - 99 mg/dL   Comment 1 Notify RN    GLUCOSE, CAPILLARY     Status: Abnormal   Collection Time    02/07/14  3:58 PM      Result Value Ref Range   Glucose-Capillary 443 (*) 70 - 99 mg/dL  GLUCOSE, CAPILLARY     Status: Abnormal   Collection Time    02/07/14  4:08 PM      Result Value Ref Range   Glucose-Capillary 434 (*) 70 - 99  mg/dL   Dg Chest 2 View  02/06/2014   CLINICAL DATA:  Shortness of breath.  EXAM: CHEST  2 VIEW  COMPARISON:  01/28/2014  FINDINGS: Lungs are adequately inflated with continued prominence of the perihilar/ bibasilar markings likely representing mild interstitial edema. Possible small amount of right pleural fluid. Cardiomediastinal silhouette and remainder the exam is unchanged.  IMPRESSION: Stable findings suggesting mild CHF. Possible small amount of right pleural fluid.   Electronically Signed   By: Marin Olp M.D.   On: 02/06/2014 11:03    Assessment:  1 AKI hemodynamically mediated 2 Chronic renal allograft dysfunction 3 CHF  Plan: 1 Continue treatment of CHF as you are doing, optimize dry weight to lower PTD 2 Prograf level 3 Cardiology eval   Estanislado Emms 02/07/2014, 5:55 PM

## 2014-02-07 NOTE — Progress Notes (Signed)
Inpatient Diabetes Program Recommendations  AACE/ADA: New Consensus Statement on Inpatient Glycemic Control (2013)  Target Ranges:  Prepandial:   less than 140 mg/dL      Peak postprandial:   less than 180 mg/dL (1-2 hours)      Critically ill patients:  140 - 180 mg/dL  Results for DINK, TILTON (MRN [id number]) as of 02/07/2014 13:15  Ref. Range 02/06/2014 13:58 02/06/2014 17:18 02/06/2014 20:53 02/07/2014 06:11 02/07/2014 11:05  Glucose-Capillary Latest Range: 70-99 mg/dL 436 (H) 067 (H) 703 (H) 236 (H) 299 (H)   Consider adding NPH 10 units in the morning.  May also need Novolog moderate correction scale.  Will follow. Thank you  Piedad Climes BSN, RN,CDE Inpatient Diabetes Coordinator [phone] (team pager)

## 2014-02-08 DIAGNOSIS — N189 Chronic kidney disease, unspecified: Secondary | ICD-10-CM

## 2014-02-08 DIAGNOSIS — N400 Enlarged prostate without lower urinary tract symptoms: Secondary | ICD-10-CM

## 2014-02-08 DIAGNOSIS — N179 Acute kidney failure, unspecified: Secondary | ICD-10-CM

## 2014-02-08 DIAGNOSIS — I5031 Acute diastolic (congestive) heart failure: Secondary | ICD-10-CM

## 2014-02-08 DIAGNOSIS — I509 Heart failure, unspecified: Secondary | ICD-10-CM

## 2014-02-08 DIAGNOSIS — K219 Gastro-esophageal reflux disease without esophagitis: Secondary | ICD-10-CM

## 2014-02-08 DIAGNOSIS — I10 Essential (primary) hypertension: Secondary | ICD-10-CM

## 2014-02-08 DIAGNOSIS — Z940 Kidney transplant status: Secondary | ICD-10-CM

## 2014-02-08 LAB — GLUCOSE, CAPILLARY
Glucose-Capillary: 222 mg/dL — ABNORMAL HIGH (ref 70–99)
Glucose-Capillary: 264 mg/dL — ABNORMAL HIGH (ref 70–99)
Glucose-Capillary: 298 mg/dL — ABNORMAL HIGH (ref 70–99)
Glucose-Capillary: 387 mg/dL — ABNORMAL HIGH (ref 70–99)

## 2014-02-08 LAB — BASIC METABOLIC PANEL
BUN: 86 mg/dL — AB (ref 6–23)
CALCIUM: 8.6 mg/dL (ref 8.4–10.5)
CO2: 28 mEq/L (ref 19–32)
CREATININE: 2.48 mg/dL — AB (ref 0.50–1.35)
Chloride: 92 mEq/L — ABNORMAL LOW (ref 96–112)
GFR calc Af Amer: 28 mL/min — ABNORMAL LOW (ref >90)
GFR, EST NON AFRICAN AMERICAN: 25 mL/min — AB (ref >90)
GLUCOSE: 242 mg/dL — AB (ref 70–99)
Potassium: 5.1 mEq/L (ref 3.7–5.3)
Sodium: 132 mEq/L — ABNORMAL LOW (ref 137–147)

## 2014-02-08 MED ORDER — FUROSEMIDE 20 MG PO TABS
60.0000 mg | ORAL_TABLET | Freq: Two times a day (BID) | ORAL | Status: DC
  Administered 2014-02-09 – 2014-02-10 (×3): 60 mg via ORAL
  Filled 2014-02-08 (×6): qty 1

## 2014-02-08 MED ORDER — INSULIN ASPART 100 UNIT/ML ~~LOC~~ SOLN
5.0000 [IU] | Freq: Three times a day (TID) | SUBCUTANEOUS | Status: DC
  Administered 2014-02-09 – 2014-02-11 (×8): 5 [IU] via SUBCUTANEOUS

## 2014-02-08 NOTE — Clinical Documentation Improvement (Signed)
  Please clarify stage of CKD. Thank you.   Possible Clinical Conditions?   CKD Stage I - GFR > OR = 90 CKD Stage II - GFR 60-80 CKD Stage III - GFR 30-59 CKD Stage IV - GFR 15-29 CKD Stage V - GFR < 15 ESRD (End Stage Renal Disease) Other condition_____________ Cannot Clinically determine    Supporting Information:  Risk Factors:  History: Renal disorder-R kidney transplant 05/14/10  ED Provider note & H&P:  Acute renal failure in a transplanted kidney - CKD s/p R renal transplant 2011  Baseline crt reportedly mid 1s per [facility] notes, with recent increase to 2-2.3 - crt steadily improved th/o the hospital stay  6/8 progress note: Acute on chronic renal failure: patient with mild elevation of his renal function most likely secondary to CHF exacerbation   6/8 Nephrology progress note:1 AKI hemodynamically mediated 2 Chronic renal allograft dysfunction    Signs & Symptoms:  Shortness of breath  Diagnostics: BUN/CR/GFR 5/31 = 68/1.85/35 6/7 = 77/2.51/24 6/8 = 82/2.42/25 6/9 = 86/2.48/25  Treatment: 6/8 progress note: renal has been consulted-will check tacrolimus level -Cr better today after lasix Diet renal/carb modified w/1200 ml fluid restriction Daily BMP IV converted to saline lock IV Lasix 40mg  bid Daily weight Strict I&O  Thank You, Harless Litten ,RN Clinical Documentation Specialist:  [phone]  [facility]- Health Information Management

## 2014-02-08 NOTE — Progress Notes (Signed)
Assessment:  1 AKI hemodynamically mediated, r/o Prograf toxicity 2 Chronic renal allograft dysfunction  3 CHF  Plan:  1 Continue treatment of CHF as you are doing, optimize dry weight to lower PTD  2 Prograf level pending (drug-drug interaction on Diflucan) 3 Rec Cardiology eval   Subjective: Interval History: Still some DOE  Objective: Vital signs in last 24 hours: Temp:  [97.6 F (36.4 C)-98 F (36.7 C)] 98 F (36.7 C) (06/09 0634) Pulse Rate:  [90-96] 93 (06/09 1042) Resp:  [17-20] 18 (06/09 1042) BP: (107-142)/(63-82) 123/63 mmHg (06/09 1042) SpO2:  [93 %-100 %] 100 % (06/09 1042) Weight:  [89.1 kg (196 lb 6.9 oz)] 89.1 kg (196 lb 6.9 oz) (06/09 0634) Weight change: -5.248 kg (-11 lb 9.1 oz)  Intake/Output from previous day: 06/08 0701 - 06/09 0700 In: 1540 [P.O.:1540] Out: 2100 [Urine:2100] Intake/Output this shift: Total I/O In: 240 [P.O.:240] Out: 300 [Urine:300]  General appearance: alert and cooperative Back: symmetric, no curvature. ROM normal. No CVA tenderness., 1+ to trace presacral edema Extremities: 1+ edema  Lab Results:  Recent Labs  02/06/14 1005  WBC 9.3  HGB 9.9*  HCT 33.0*  PLT 244   BMET:  Recent Labs  02/07/14 0124 02/08/14 0405  NA 133* 132*  K 5.6* 5.1  CL 93* 92*  CO2 27 28  GLUCOSE 327* 242*  BUN 82* 86*  CREATININE 2.42* 2.48*  CALCIUM 8.8 8.6   No results found for this basename: PTH,  in the last 72 hours Iron Studies: No results found for this basename: IRON, TIBC, TRANSFERRIN, FERRITIN,  in the last 72 hours Studies/Results: No results found.  Scheduled: . allopurinol  100 mg Oral q morning - 10a  . aspirin EC  81 mg Oral Q M,W,F  . finasteride  5 mg Oral Daily  . fluconazole  400 mg Oral Daily  . furosemide  40 mg Intravenous BID  . heparin  5,000 Units Subcutaneous 3 times per day  . insulin aspart  0-5 Units Subcutaneous QHS  . insulin aspart  0-9 Units Subcutaneous TID WC  . insulin NPH Human  16 Units  Subcutaneous BID AC & HS  . magnesium oxide  400 mg Oral Daily  . pantoprazole  40 mg Oral Daily  . predniSONE  40 mg Oral Q breakfast  . simvastatin  40 mg Oral q1800  . sodium chloride  3 mL Intravenous Q12H  . tacrolimus  0.5 mg Oral q morning - 10a   And  . tacrolimus  1 mg Oral QPM  . terazosin  10 mg Oral QPM  . cyanocobalamin  1,000 mcg Oral Daily     LOS: 2 days   Lauris Poag 02/08/2014,11:33 AM

## 2014-02-08 NOTE — Progress Notes (Signed)
Inpatient Diabetes Program Recommendations  AACE/ADA: New Consensus Statement on Inpatient Glycemic Control (2013)  Target Ranges:  Prepandial:   less than 140 mg/dL      Peak postprandial:   less than 180 mg/dL (1-2 hours)      Critically ill patients:  140 - 180 mg/dL  Results for FINEAS, TWEET (MRN [id number]) as of 02/08/2014 12:04  Ref. Range 02/07/2014 06:11 02/07/2014 11:05 02/07/2014 15:58 02/07/2014 16:08 02/07/2014 21:11 02/08/2014 06:42 02/08/2014 11:06  Glucose-Capillary Latest Range: 70-99 mg/dL 387 (H) 564 (H) 332 (H) 434 (H) 349 (H) 222 (H) 264 (H)    Inpatient Diabetes Program Recommendations Insulin - Meal Coverage: Add Novolog 5 units TID with meals per Glycemic Control Order Set Thank you  Piedad Climes BSN, RN,CDE Inpatient Diabetes Coordinator [phone] (team pager)

## 2014-02-08 NOTE — Progress Notes (Addendum)
[facility] PROGRESS NOTE Interim History: 73 y/o male with hx of diastolic heart failure, discharged on 5/31 after admission for CHF exacerbation at that time; has noticed increased SOB and LE swelling.  Filed Weights   02/06/14 1401 02/07/14 0617 02/08/14 0634  Weight: 90.08 kg (198 lb 9.5 oz) 89.5 kg (197 lb 5 oz) 89.1 kg (196 lb 6.9 oz)        Intake/Output Summary (Last 24 hours) at 02/08/14 1953 Last data filed at 02/08/14 1757  Gross per 24 hour  Intake   1200 ml  Output   1750 ml  Net   -550 ml     Assessment/Plan: 1-acute on chronic diastolic CHF (congestive heart failure): most likely. Last 2-D echo with EF 50-55% on 08/2013.  -breathing better today -continue IV lasix; will transition to PO lasix and observe urine output. -daily weight, low sodium diet, strict intake and output  2-Acute on chronic renal failure: (stage 4 at baseline) patient with mild elevation of his renal function  -most likely secondary to CHF exacerbation  -renal has been consulted -will follow tacrolimus level -Cr better today after lasix  3-History of renal transplantation: follow renal recommendations -continue tacrolimus  -continue prednisone -renal service consulted  4-diabetes: -A1C 7.6 -continue NPH BID now for better control -will add meal coverage with novolog 5 unts  5-GERD: continue PPI  6-HLD: continue statins  7-BPH: continue hytrin    Code Status: full Family Communication: wife at bedside  Disposition Plan: to be determine    Consultants:  Renal service   Procedures: ECHO: pending  Antibiotics:  None   HPI/Subjective: Afebrile; denies CP. Breathing a lot better. Good O2 sat on RA; still with some short weaned sensation with exertion.  Objective: Filed Vitals:   02/07/14 2050 02/08/14 0634 02/08/14 1042 02/08/14 1529  BP: 107/69 118/69 123/63 117/73  Pulse: 93 92 93 92  Temp: 97.7 F (36.5 C) 98 F (36.7 C)  98.6 F (37 C)  TempSrc:  Oral Oral  Oral  Resp: 18 17 18 18   Height:      Weight:  89.1 kg (196 lb 6.9 oz)    SpO2: 94% 93% 100% 100%     Exam:  General: Alert, awake, oriented x3; feeling better and breathing better today HEENT: No bruits, no goiter; no JVD Heart: Regular rate and rhythm, no rubs or gallops; trace edema Lungs: Good air movement, no frank crackles Abdomen: Soft, nontender, nondistended, positive bowel sounds.  Neuro: Grossly intact, nonfocal.   Data Reviewed: Basic Metabolic Panel:  Recent Labs Lab 02/06/14 1005 02/07/14 0124 02/08/14 0405  NA 132* 133* 132*  K 5.4* 5.6* 5.1  CL 91* 93* 92*  CO2 24 27 28   GLUCOSE 210* 327* 242*  BUN 77* 82* 86*  CREATININE 2.51* 2.42* 2.48*  CALCIUM 9.1 8.8 8.6   CBC:  Recent Labs Lab 02/06/14 1005  WBC 9.3  HGB 9.9*  HCT 33.0*  MCV 78.4  PLT 244   Cardiac Enzymes:  Recent Labs Lab 02/06/14 1413 02/06/14 2005 02/07/14 0124  TROPONINI <0.30 <0.30 <0.30   BNP (last 3 results)  Recent Labs  08/26/13 0452 01/28/14 0241 02/06/14 1005  PROBNP 3270.0* 33416.0* 34741.0*   CBG:  Recent Labs Lab 02/07/14 1608 02/07/14 2111 02/08/14 0642 02/08/14 1106 02/08/14 1604  GLUCAP 434* 349* 222* 264* 298*    Studies: No results found.  Scheduled Meds: . allopurinol  100 mg Oral q morning - 10a  . aspirin EC  81 mg Oral Q M,W,F  . finasteride  5 mg Oral Daily  . fluconazole  400 mg Oral Daily  . furosemide  40 mg Intravenous BID  . heparin  5,000 Units Subcutaneous 3 times per day  . insulin aspart  0-5 Units Subcutaneous QHS  . insulin aspart  0-9 Units Subcutaneous TID WC  . [START ON 02/09/2014] insulin aspart  5 Units Subcutaneous TID WC  . insulin NPH Human  16 Units Subcutaneous BID AC & HS  . magnesium oxide  400 mg Oral Daily  . pantoprazole  40 mg Oral Daily  . predniSONE  40 mg Oral Q breakfast  . simvastatin  40 mg Oral q1800  . sodium chloride  3 mL Intravenous Q12H  . tacrolimus  0.5 mg Oral q morning -  10a   And  . tacrolimus  1 mg Oral QPM  . terazosin  10 mg Oral QPM  . cyanocobalamin  1,000 mcg Oral Daily   Continuous Infusions:   Time: >30 minutes  Vassie Lollarlos Kaila Devries  [facility] Pager [phone]. If 8PM-8AM, please contact night-coverage at [email or web address], password [email or web address] 02/08/2014, 7:53 PM  LOS: 2 days     **Disclaimer: This note may have been dictated with voice recognition software. Similar sounding words can inadvertently be transcribed and this note may contain transcription errors which may not have been corrected upon publication of note.**

## 2014-02-09 ENCOUNTER — Encounter (HOSPITAL_COMMUNITY)
Admission: EM | Disposition: A | Discharge status: Home / Self Care | Payer: Self-pay | Source: Home / Self Care | Attending: Internal Medicine

## 2014-02-09 ENCOUNTER — Inpatient Hospital Stay (HOSPITAL_COMMUNITY): Payer: Non-veteran care

## 2014-02-09 DIAGNOSIS — E1029 Type 1 diabetes mellitus with other diabetic kidney complication: Secondary | ICD-10-CM

## 2014-02-09 DIAGNOSIS — I509 Heart failure, unspecified: Secondary | ICD-10-CM

## 2014-02-09 HISTORY — PX: RIGHT HEART CATHETERIZATION: SHX5447

## 2014-02-09 LAB — POCT I-STAT 3, VENOUS BLOOD GAS (G3P V)
ACID-BASE EXCESS: 4 mmol/L — AB (ref 0.0–2.0)
Bicarbonate: 27.2 mEq/L — ABNORMAL HIGH (ref 20.0–24.0)
O2 Saturation: 63 %
PH VEN: 7.474 — AB (ref 7.250–7.300)
TCO2: 28 mmol/L (ref 0–100)
pCO2, Ven: 37 mmHg — ABNORMAL LOW (ref 45.0–50.0)
pO2, Ven: 30 mmHg (ref 30.0–45.0)

## 2014-02-09 LAB — GLUCOSE, CAPILLARY
GLUCOSE-CAPILLARY: 158 mg/dL — AB (ref 70–99)
Glucose-Capillary: 245 mg/dL — ABNORMAL HIGH (ref 70–99)
Glucose-Capillary: 276 mg/dL — ABNORMAL HIGH (ref 70–99)
Glucose-Capillary: 427 mg/dL — ABNORMAL HIGH (ref 70–99)

## 2014-02-09 LAB — BASIC METABOLIC PANEL
BUN: 94 mg/dL — ABNORMAL HIGH (ref 6–23)
CHLORIDE: 94 meq/L — AB (ref 96–112)
CO2: 26 meq/L (ref 19–32)
Calcium: 8.7 mg/dL (ref 8.4–10.5)
Creatinine, Ser: 2.64 mg/dL — ABNORMAL HIGH (ref 0.50–1.35)
GFR calc Af Amer: 26 mL/min — ABNORMAL LOW (ref >90)
GFR calc non Af Amer: 23 mL/min — ABNORMAL LOW (ref >90)
GLUCOSE: 250 mg/dL — AB (ref 70–99)
Potassium: 5.7 mEq/L — ABNORMAL HIGH (ref 3.7–5.3)
SODIUM: 135 meq/L — AB (ref 137–147)

## 2014-02-09 LAB — TACROLIMUS LEVEL: Tacrolimus (FK506) - LabCorp: 12.4 ng/mL

## 2014-02-09 SURGERY — RIGHT HEART CATH
Anesthesia: LOCAL

## 2014-02-09 MED ORDER — ASPIRIN 325 MG PO TABS
325.0000 mg | ORAL_TABLET | Freq: Every day | ORAL | Status: DC
  Administered 2014-02-10 – 2014-02-11 (×2): 325 mg via ORAL
  Filled 2014-02-09 (×2): qty 1

## 2014-02-09 MED ORDER — SODIUM CHLORIDE 0.9 % IJ SOLN: 3.0000 mL | INTRAMUSCULAR | Status: DC | PRN

## 2014-02-09 MED ORDER — ISOSORBIDE DINITRATE 10 MG PO TABS
10.0000 mg | ORAL_TABLET | Freq: Three times a day (TID) | ORAL | Status: DC
  Administered 2014-02-09 – 2014-02-11 (×8): 10 mg via ORAL
  Filled 2014-02-09 (×10): qty 1

## 2014-02-09 MED ORDER — LIDOCAINE HCL (PF) 1 % IJ SOLN
INTRAMUSCULAR | Status: AC
  Filled 2014-02-09: qty 30

## 2014-02-09 MED ORDER — ZOLPIDEM TARTRATE 5 MG PO TABS
5.0000 mg | ORAL_TABLET | Freq: Every evening | ORAL | Status: DC | PRN
  Administered 2014-02-10 (×2): 5 mg via ORAL
  Filled 2014-02-09 (×2): qty 1

## 2014-02-09 MED ORDER — HEPARIN (PORCINE) IN NACL 2-0.9 UNIT/ML-% IJ SOLN
INTRAMUSCULAR | Status: AC
  Filled 2014-02-09: qty 1000

## 2014-02-09 MED ORDER — SODIUM CHLORIDE 0.9 % IJ SOLN: 3.0000 mL | Freq: Two times a day (BID) | INTRAMUSCULAR | Status: DC

## 2014-02-09 MED ORDER — ASPIRIN 81 MG PO CHEW
81.0000 mg | CHEWABLE_TABLET | ORAL | Status: AC
  Administered 2014-02-09: 81 mg via ORAL
  Filled 2014-02-09: qty 1

## 2014-02-09 MED ORDER — SODIUM CHLORIDE 0.9 % IV SOLN: 250.0000 mL | INTRAVENOUS | Status: DC | PRN

## 2014-02-09 MED ORDER — CHLORHEXIDINE GLUCONATE CLOTH 2 % EX PADS
6.0000 | MEDICATED_PAD | Freq: Every day | CUTANEOUS | Status: DC
  Administered 2014-02-10: 6 via TOPICAL

## 2014-02-09 NOTE — Interval H&P Note (Signed)
History and Physical Interval Note:  02/09/2014 2:12 PM  Jon Burns  has presented today for surgery, with the diagnosis of cp  The various methods of treatment have been discussed with the patient and family. After consideration of risks, benefits and other options for treatment, the patient has consented to  Procedure(s): RIGHT HEART CATH (N/A) as a surgical intervention .  The patient's history has been reviewed, patient examined, no change in status, stable for surgery.  I have reviewed the patient's chart and labs.  Questions were answered to the patient's satisfaction.     [name]

## 2014-02-09 NOTE — Consult Note (Signed)
CARDIOLOGY CONSULT NOTE       Patient ID: Jon Burns MRN: [id number] DOB/AGE: 1941/06/18 73 y.o.  Admit date: 02/06/2014 Referring Physician:  Gwenlyn PerkingMadera Primary Physician: Marin CommentWELLS, CHERYL, FNP Primary Cardiologist:  Oley BalmKoneshwaren Reason for Consultation:  CHF  Active Problems:   Acute diastolic heart failure   History of renal transplantation   Dyspnea   Acute renal failure   CHF (congestive heart failure)   HPI:   [name]  Initially seen 9/14 as consult.   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 In about April of last year , he had been on at least 2-3 antihypertensive medications along with Lasix, but as he apparently became fatigued and volume depleted, his Lasix was reduced to 20 mg daily and his amlodipine was discontinued.   05/15/13, he had some  Diastolic heart failure relieved with iv lasix and resumption of BP meds  [facility] 02[facility]02[facility]2013 showed normal LV systolic function, EF>55%, with severe concentric LVH and mild LAE (no comment on diastolic function or LVEDP).  His stress test was submaximal in that he did not reach an adequate heart rate, although no inducible wall motion abnormalities were noted.  Readmitted twice now with dyspnea and elevated BNP with worsening Cr.  5/29 and 6/7.  Baseline Cr is 2.5  BNP was over 30,000  Echo not done yet this admission.  Compliant with meds taking bid lasix And urinating ok On low dose prednisone for rejection.  BP labile  No chest pain and both admissions negative troponin and no acute ECG changes  Feels better again with iv lasix     ROS All other systems reviewed and negative except as noted above  Past Medical History  Diagnosis Date  . Hypertension   . CHF (congestive heart failure)   . Hypercholesteremia   . Heart murmur   . Pneumonia 2013  .  Shortness of breath     "can come on at anytime lately; sometimes related to his CHF" (08/27/2013)  . Sleep apnea     "positively has it but never tested"/daughter @ bedside (08/27/2013)  . Type II diabetes mellitus   . GERD (gastroesophageal reflux disease)   . Gout   . Renal disorder     R kidney transplant 05/14/10    Family History  Problem Relation Age of Onset  . Cancer Mother     unsure of type    History   Social History  . Marital Status: Married    Spouse Name: N/A    Number of Children: N/A  . Years of Education: N/A   Occupational History  . Not on file.   Social History Main Topics  . Smoking status: Former Smoker -- 1.50 packs/day for 40 years    Types: Cigarettes    Quit date: 09/02/1992  . Smokeless tobacco: Never Used  . Alcohol Use: No  . Drug Use: No  . Sexual Activity: No   Other Topics Concern  . Not on file   Social History Narrative  . No narrative on file    Past Surgical History  Procedure Laterality Date  . Kidney transplant Right 05/14/2010  . Av fistula placement Left July 2011  . Nephrectomy transplanted organ    . Shoulder open rotator cuff repair Right 1990's  . Shoulder arthroscopy  w/ rotator cuff repair Left 1990's  . Cataract extraction w/ intraocular lens  implant, bilateral  2014     . allopurinol  100 mg Oral q morning - 10a  . aspirin EC  81 mg Oral Q M,W,F  . finasteride  5 mg Oral Daily  . fluconazole  400 mg Oral Daily  . furosemide  60 mg Oral BID  . heparin  5,000 Units Subcutaneous 3 times per day  . insulin aspart  0-5 Units Subcutaneous QHS  . insulin aspart  0-9 Units Subcutaneous TID WC  . insulin aspart  5 Units Subcutaneous TID WC  . insulin NPH Human  16 Units Subcutaneous BID AC & HS  . isosorbide dinitrate  10 mg Oral TID  . magnesium oxide  400 mg Oral Daily  . pantoprazole  40 mg Oral Daily  . predniSONE  40 mg Oral Q breakfast  . simvastatin  40 mg Oral q1800  . sodium chloride  3 mL Intravenous  Q12H  . tacrolimus  0.5 mg Oral q morning - 10a   And  . tacrolimus  1 mg Oral QPM  . terazosin  10 mg Oral QPM  . cyanocobalamin  1,000 mcg Oral Daily      Physical Exam: Blood pressure 125/78, pulse 92, temperature 97.3 F (36.3 C), temperature source Oral, resp. rate 20, height 6\' 1"  (1.854 m), weight 196 lb 3.4 oz (89 kg), SpO2 100.00%.   Affect appropriate Chronically ill white male  HEENT: normal Neck supple with no adenopathy JVP normal no bruits no thyromegaly Lungs clear with no wheezing and good diaphragmatic motion Heart:  S1/S2 SEM murmur, no rub, gallop or click PMI normal Abdomen: benighn, BS positve, no tenderness, no AAA post renal transplant no bruit.  No HSM or HJR Distal pulses intact with no bruits No edema Neuro non-focal Skin warm and dry No muscular weakness   Labs:   Lab Results  Component Value Date   WBC 9.3 02/06/2014   HGB 9.9* 02/06/2014   HCT 33.0* 02/06/2014   MCV 78.4 02/06/2014   PLT 244 02/06/2014    Recent Labs Lab 02/09/14 0535  NA 135*  K 5.7*  CL 94*  CO2 26  BUN 94*  CREATININE 2.64*  CALCIUM 8.7  GLUCOSE 250*   Lab Results  Component Value Date   TROPONINI <0.30 02/07/2014    Lab Results  Component Value Date   CHOL 96 05/16/2013   Lab Results  Component Value Date   HDL 63 05/16/2013   Lab Results  Component Value Date   LDLCALC 23 05/16/2013   Lab Results  Component Value Date   TRIG 49 05/16/2013   Lab Results  Component Value Date   CHOLHDL 1.5 05/16/2013   No results found for this basename: LDLDIRECT      Radiology: Dg Chest 2 View  02/09/2014   CLINICAL DATA:  Shortness of breath.  EXAM: CHEST  2 VIEW  COMPARISON:  02/06/2014.  FINDINGS: Mediastinum and hilar structures stable. Stable cardiomegaly with mild pulmonary vascular prominence. Diffuse interstitial prominence with Kerley B-lines again noted. These findings are consistent with congestive heart failure and interstitial edema. No pleural effusion or  pneumothorax. No acute osseous abnormality .  IMPRESSION: Persistent changes of congestive heart failure and pulmonary interstitial edema.   Electronically Signed   By: Maisie Fus  Register   On: 02/09/2014 07:50   Dg Chest 2 View  02/06/2014   CLINICAL DATA:  Shortness of breath.  EXAM:  CHEST  2 VIEW  COMPARISON:  01/28/2014  FINDINGS: Lungs are adequately inflated with continued prominence of the perihilar/ bibasilar markings likely representing mild interstitial edema. Possible small amount of right pleural fluid. Cardiomediastinal silhouette and remainder the exam is unchanged.  IMPRESSION: Stable findings suggesting mild CHF. Possible small amount of right pleural fluid.   Electronically Signed   By: Elberta Fortis M.D.   On: 02/06/2014 11:03   Dg Chest 2 View  01/28/2014   CLINICAL DATA:  Shortness of breath and wheezing.  EXAM: CHEST  2 VIEW  COMPARISON:  Chest radiograph performed 08/27/2013, and CT of the chest performed 10/11/2013  FINDINGS: Known pulmonary nodules are not well characterized on radiograph.  The lungs are well-aerated. Vascular congestion is noted, with increased interstitial markings, likely reflecting mild pulmonary edema. No pleural effusion or pneumothorax is seen.  The heart is normal in size; the mediastinal contour is within normal limits. No acute osseous abnormalities are seen.  IMPRESSION: Vascular congestion, with increased interstitial markings, raising concern for mild pulmonary edema. Known pulmonary nodules are not well characterized on radiograph.   Electronically Signed   By: Roanna Raider M.D.   On: 01/28/2014 02:54   Dg Foot Complete Right  02/01/2014   X-ray report weightbearing right foot  Fracture base fifth right metatarsal without displacement Other than the above noted fracture there are no further additional  fractures and or dislocations noted No emphysema noted Note increased soft tissue density noted  Radiographic impression: Fracture base fifth right  metatarsal  Dg Foot Complete Right  01/21/2014   X-ray report weightbearing right foot  Fracture base of fifth metatarsal without displacement Inferior calcaneal spur Increased soft tissue density noted on the lateral view  Radiographic impression: Fracture base of fifth metatarsal (Jones fracture)   EKG:  SR rate 96 ICLBBB nonspecific lateral ST segments   ASSESSMENT AND PLAN:   Diastolic CHF:  Difficult issue due to CRF.  Discussed sliding scale lasix with patient  Add isordil 10 q8 for pre load reduction.  Discussed right heart cath with patient to see where we stand with his filling pressures in light of his CRF.  He is agreeable.  Suspect he will do best with combination of hydralazine and nitrates along with as much lasix as he will tolerate without bumping Cr to over 3.0  Will try to arrange right heart for today HTN:  See above  hytrin ? Being used for prostate as well CRF:  Continue prograf and prednisone  Level pending  F/U with Dr Lowell Guitar  This is major issue driving diastolic CHF along with his LVH  Signed: Charlton Haws 02/09/2014, 9:17 AM

## 2014-02-09 NOTE — CV Procedure (Addendum)
   Cardiac Catheterization Procedure Note  Name: Jon Burns MRN: [id number] DOB: [birth year]  Procedure: Right Heart Cath  Indication: Congestive heart failure and renal failure.   Procedural Details: The right groin was prepped, draped, and anesthetized with 1% lidocaine. Using the modified Seldinger technique a  7 French sheath was placed in the right femoral vein. A Swan-Ganz catheter was used for the right heart catheterization. Standard protocol was followed for recording of right heart pressures and sampling of oxygen saturations. Fick cardiac output was calculated. There were no immediate procedural complications. The patient was transferred to the post catheterization recovery area for further monitoring.  Procedural Findings: Hemodynamics RA 7 mmHg RV 32 /8 mmHg PA 32/20  mmHg PCWP 14 mmHg  Oxygen saturations:  PA 63 AO 98  Cardiac Output (Fick) 6.01  Cardiac Index (Fick) 2.82  Cardiac output by thermodilution: 5.32  Pulmonary vascular resistance (PVR): 1.9 Woods units.  Final Conclusions:   1. Mildly elevated filling pressures with no significant pulmonary hypertension. 2. Normal cardiac output.  Recommendations:  Continue medical therapy. I did not change the dose of Lasix.  [name] MD, [facility] 02/09/2014, 2:41 PM

## 2014-02-09 NOTE — Care Management Note (Signed)
    Page 1 of 1   02/09/2014     1:45:13 PM CARE MANAGEMENT NOTE 02/09/2014  Patient:  Jon Burns, Jon Burns   Account Number:  [id number]  Date Initiated:  02/09/2014  Documentation initiated by:  Oletta Cohn  Subjective/Objective Assessment:   73 y.o. male was discharged from the hospital 5/31 for CHF. Notes over the last week he has become increasingly SOB. //Home with spouse.     Action/Plan:   IV lasix, daily weight, strict I/Os.// Access for [facility] needs.   Anticipated DC Date:  02/11/2014   Anticipated DC Plan:  HOME/SELF CARE      DC Planning Services  CM consult      PAC Choice  NA   Choice offered to / List presented to:          [facility] arranged  HH - 11 Patient Refused      Status of service:   Medicare Important Message given?  NO (If response is "NO", the following Medicare IM given date fields will be blank) Date Medicare IM given:   Date Additional Medicare IM given:    Discharge Disposition:    Per UR Regulation:  Reviewed for med. necessity/level of care/duration of stay  If discussed at Long Length of Stay Meetings, dates discussed:    Comments:  02/09/14 1030 Leodis Alcocer, RN, BSN, [place] [phone] I have recommended that this patient have Home Health Services but he declines at this time. I have discussed the risks and benefits of HH with him. The patient verbalizes understanding.

## 2014-02-09 NOTE — Progress Notes (Signed)
[facility] PROGRESS NOTE Interim History: 73 y/o male with hx of diastolic heart failure, discharged on 5/31 after admission for CHF exacerbation at that time; has noticed increased SOB and LE swelling.  Filed Weights   02/07/14 0617 02/08/14 0634 02/09/14 0600  Weight: 89.5 kg (197 lb 5 oz) 89.1 kg (196 lb 6.9 oz) 89 kg (196 lb 3.4 oz)        Intake/Output Summary (Last 24 hours) at 02/09/14 2159 Last data filed at 02/09/14 2003  Gross per 24 hour  Intake    510 ml  Output   1876 ml  Net  -1366 ml     Assessment/Plan: 1-acute on chronic combined CHF (congestive heart failure): most likely. Last 2-D echo with EF 50-55% on 08/2013.  -breathing better today -transitioned to PO lasix today; evaluate urine output and after discussing with cardiology will have RHC. -daily weight, low sodium diet, strict intake and output -will follow results and further rec's -patient feeling better and breathing better. 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 -follow levels now that lasix has been transitioned to PO  3-History of renal transplantation: follow renal recommendations -continue tacrolimus  -continue prednisone -renal service consulted  4-diabetes: -A1C 7.6 -continue NPH BID now for better control -continue also novolog 5 units for meal coverage  5-GERD: continue PPI  6-HLD: continue statins  7-BPH: continue hytrin    Code Status: full Family Communication: wife at bedside  Disposition Plan: to be determine    Consultants:  Renal service   Cardiology   Procedures: ECHO: - Left ventricle: The cavity size was mildly dilated. Wall thickness was  increased in a pattern of mild LVH. Systolic function was severely reduced. The estimated ejection fraction was in the range of 25% to 30%. Diffuse hypokinesis. Features are consistent with a pseudonormal left ventricular filling pattern, with concomitant abnormal relaxation and increased filling pressure (grade 2 diastolic dysfunction). - Aortic valve: There was no stenosis. - Mitral valve: Mildly calcified annulus. There was no significant regurgitation. - Left atrium: The atrium was moderately dilated. - Right ventricle: The cavity size was normal. Systolic function was mildly reduced. - Pulmonary arteries: No complete TR doppler jet so unable to estimate PA systolic pressure. - Systemic veins: IVC measured 2.4 cm with normal respirophasic variation, suggesting RA pressure 8 mmHg. - Pericardium, extracardiac: A trivial pericardial effusion was identified.   Antibiotics:  None   HPI/Subjective: Afebrile; denies CP. Breathing a lot better. Good O2 sat on RA; still with some short weaned sensation with exertion.  Objective: Filed Vitals:   02/09/14 1730 02/09/14 1813 02/09/14 1900 02/09/14 2054  BP: 127/81 111/77 121/79 105/65  Pulse: 97 98 105 87  Temp:    98.2 F (36.8 C)  TempSrc:    Oral  Resp: 19 17 18 20   Height:      Weight:      SpO2: 99% 98% 98% 98%     Exam:  General: Alert, awake, oriented x3; feeling better and breathing better today HEENT: No bruits, no goiter; no JVD Heart: Regular rate and rhythm, no rubs or gallops; trace edema Lungs: Good air movement, no frank crackles Abdomen: Soft, nontender, nondistended, positive bowel sounds.  Neuro: Grossly intact, nonfocal.   Data Reviewed: Basic Metabolic Panel:  Recent Labs Lab 02/06/14 1005 02/07/14 0124 02/08/14 0405 02/09/14 0535  NA 132* 133* 132* 135*  K 5.4* 5.6* 5.1 5.7*  CL 91* 93* 92* 94*  CO2 24 27 28 26   GLUCOSE 210* 327* 242* 250*  BUN 77* 82* 86* 94*  CREATININE 2.51* 2.42*  2.48* 2.64*  CALCIUM 9.1 8.8 8.6 8.7   CBC:  Recent Labs Lab 02/06/14 1005  WBC 9.3  HGB 9.9*  HCT 33.0*  MCV 78.4  PLT 244   Cardiac Enzymes:  Recent Labs Lab 02/06/14 1413 02/06/14 2005 02/07/14 0124  TROPONINI <0.30 <0.30 <0.30   BNP (last 3 results)  Recent Labs  08/26/13 0452 01/28/14 0241 02/06/14 1005  PROBNP 3270.0* 33416.0* 34741.0*   CBG:  Recent Labs Lab 02/08/14 1604 02/08/14 2125 02/09/14 0601 02/09/14 1045 02/09/14 1739  GLUCAP 298* 387* 245* 158* 276*    Studies: Dg Chest 2 View  02/09/2014   CLINICAL DATA:  Shortness of breath.  EXAM: CHEST  2 VIEW  COMPARISON:  02/06/2014.  FINDINGS: Mediastinum and hilar structures stable. Stable cardiomegaly with mild pulmonary vascular prominence. Diffuse interstitial prominence with Kerley B-lines again noted. These findings are consistent with congestive heart failure and interstitial edema. No pleural effusion or pneumothorax. No acute osseous abnormality .  IMPRESSION: Persistent changes of congestive heart failure and pulmonary interstitial edema.   Electronically Signed   By: Maisie Fushomas  Register   On: 02/09/2014 07:50    Scheduled Meds: . allopurinol  100 mg Oral q morning - 10a  . [START ON 02/10/2014] aspirin  325 mg Oral Daily  . finasteride  5 mg Oral Daily  . fluconazole  400 mg Oral Daily  . furosemide  60 mg Oral BID  . heparin  5,000 Units Subcutaneous 3 times per day  . insulin aspart  0-5 Units Subcutaneous QHS  . insulin aspart  0-9 Units Subcutaneous TID WC  . insulin aspart  5 Units Subcutaneous TID WC  . insulin NPH Human  16 Units Subcutaneous BID AC & HS  . isosorbide dinitrate  10 mg Oral TID  . magnesium oxide  400 mg Oral Daily  . pantoprazole  40 mg Oral Daily  . predniSONE  40 mg Oral Q breakfast  . simvastatin  40 mg Oral q1800  . sodium chloride  3 mL Intravenous Q12H  . tacrolimus  0.5 mg Oral q morning - 10a   And  . tacrolimus  1 mg Oral QPM  . terazosin  10 mg Oral  QPM  . cyanocobalamin  1,000 mcg Oral Daily   Continuous Infusions:   Time: >30 minutes  Vassie LollMadera, Randi Poullard  [facility] Pager [phone]. If 8PM-8AM, please contact night-coverage at [email or web address], password [email or web address] 02/09/2014, 9:59 PM  LOS: 3 days     **Disclaimer: This note may have been dictated with voice recognition software. Similar sounding words can inadvertently be transcribed and this note may contain transcription errors which may not have been corrected upon publication of note.**

## 2014-02-09 NOTE — H&P (View-Only) (Signed)
CARDIOLOGY CONSULT NOTE       Patient ID: Jon Burns MRN: [id number] DOB/AGE: 1941/06/18 73 y.o.  Admit date: 02/06/2014 Referring Physician:  Gwenlyn Burns Primary Physician: Jon Burns Primary Cardiologist:  Jon Burns Reason for Consultation:  CHF  Active Problems:   Acute diastolic heart failure   History of renal transplantation   Dyspnea   Acute renal failure   CHF (congestive heart failure)   HPI:   73 yo patient of Jon Burns  Initially seen 9/14 as consult.   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 In about April of last year , he had been on at least 2-3 antihypertensive medications along with Lasix, but as he apparently became fatigued and volume depleted, his Lasix was reduced to 20 mg daily and his amlodipine was discontinued.   05/15/13, he had some  Diastolic heart failure relieved with iv lasix and resumption of BP meds  An echocardi[facility] on 02[facility]02-2013 showed normal LV systolic function, EF>55%, with severe concentric LVH and mild LAE (no comment on diastolic function or LVEDP).  His stress test was submaximal in that he did not reach an adequate heart rate, although no inducible wall motion abnormalities were noted.  Readmitted twice now with dyspnea and elevated BNP with worsening Cr.  5/29 and 6/7.  Baseline Cr is 2.5  BNP was over 30,000  Echo not done yet this admission.  Compliant with meds taking bid lasix And urinating ok On low dose prednisone for rejection.  BP labile  No chest pain and both admissions negative troponin and no acute ECG changes  Feels better again with iv lasix     ROS All other systems reviewed and negative except as noted above  Past Medical History  Diagnosis Date  . Hypertension   . CHF (congestive heart failure)   . Hypercholesteremia   . Heart murmur   . Pneumonia 2013  .  Shortness of breath     "can come on at anytime lately; sometimes related to his CHF" (08/27/2013)  . Sleep apnea     "positively has it but never tested"/daughter @ bedside (08/27/2013)  . Type II diabetes mellitus   . GERD (gastroesophageal reflux disease)   . Gout   . Renal disorder     R kidney transplant 05/14/10    Family History  Problem Relation Age of Onset  . Cancer Mother     unsure of type    History   Social History  . Marital Status: Married    Spouse Name: N/A    Number of Children: N/A  . Years of Education: N/A   Occupational History  . Not on file.   Social History Main Topics  . Smoking status: Former Smoker -- 1.50 packs/day for 40 years    Types: Cigarettes    Quit date: 09/02/1992  . Smokeless tobacco: Never Used  . Alcohol Use: No  . Drug Use: No  . Sexual Activity: No   Other Topics Concern  . Not on file   Social History Narrative  . No narrative on file    Past Surgical History  Procedure Laterality Date  . Kidney transplant Right 05/14/2010  . Av fistula placement Left July 2011  . Nephrectomy transplanted organ    . Shoulder open rotator cuff repair Right 1990's  . Shoulder arthroscopy  w/ rotator cuff repair Left 1990's  . Cataract extraction w/ intraocular lens  implant, bilateral  2014     . allopurinol  100 mg Oral q morning - 10a  . aspirin EC  81 mg Oral Q M,W,F  . finasteride  5 mg Oral Daily  . fluconazole  400 mg Oral Daily  . furosemide  60 mg Oral BID  . heparin  5,000 Units Subcutaneous 3 times per day  . insulin aspart  0-5 Units Subcutaneous QHS  . insulin aspart  0-9 Units Subcutaneous TID WC  . insulin aspart  5 Units Subcutaneous TID WC  . insulin NPH Human  16 Units Subcutaneous BID AC & HS  . isosorbide dinitrate  10 mg Oral TID  . magnesium oxide  400 mg Oral Daily  . pantoprazole  40 mg Oral Daily  . predniSONE  40 mg Oral Q breakfast  . simvastatin  40 mg Oral q1800  . sodium chloride  3 mL Intravenous  Q12H  . tacrolimus  0.5 mg Oral q morning - 10a   And  . tacrolimus  1 mg Oral QPM  . terazosin  10 mg Oral QPM  . cyanocobalamin  1,000 mcg Oral Daily      Physical Exam: Blood pressure 125/78, pulse 92, temperature 97.3 F (36.3 C), temperature source Oral, resp. rate 20, height 6\' 1"  (1.854 m), weight 196 lb 3.4 oz (89 kg), SpO2 100.00%.   Affect appropriate Chronically ill white male  HEENT: normal Neck supple with no adenopathy JVP normal no bruits no thyromegaly Lungs clear with no wheezing and good diaphragmatic motion Heart:  S1/S2 SEM murmur, no rub, gallop or click PMI normal Abdomen: benighn, BS positve, no tenderness, no AAA post renal transplant no bruit.  No HSM or HJR Distal pulses intact with no bruits No edema Neuro non-focal Skin warm and dry No muscular weakness   Labs:   Lab Results  Component Value Date   WBC 9.3 02/06/2014   HGB 9.9* 02/06/2014   HCT 33.0* 02/06/2014   MCV 78.4 02/06/2014   PLT 244 02/06/2014    Recent Labs Lab 02/09/14 0535  NA 135*  K 5.7*  CL 94*  CO2 26  BUN 94*  CREATININE 2.64*  CALCIUM 8.7  GLUCOSE 250*   Lab Results  Component Value Date   TROPONINI <0.30 02/07/2014    Lab Results  Component Value Date   CHOL 96 05/16/2013   Lab Results  Component Value Date   HDL 63 05/16/2013   Lab Results  Component Value Date   LDLCALC 23 05/16/2013   Lab Results  Component Value Date   TRIG 49 05/16/2013   Lab Results  Component Value Date   CHOLHDL 1.5 05/16/2013   No results found for this basename: LDLDIRECT      Radiology: Dg Chest 2 View  02/09/2014   CLINICAL DATA:  Shortness of breath.  EXAM: CHEST  2 VIEW  COMPARISON:  02/06/2014.  FINDINGS: Mediastinum and hilar structures stable. Stable cardiomegaly with mild pulmonary vascular prominence. Diffuse interstitial prominence with Kerley B-lines again noted. These findings are consistent with congestive heart failure and interstitial edema. No pleural effusion or  pneumothorax. No acute osseous abnormality .  IMPRESSION: Persistent changes of congestive heart failure and pulmonary interstitial edema.   Electronically Signed   By: Maisie Fus  Register   On: 02/09/2014 07:50   Dg Chest 2 View  02/06/2014   CLINICAL DATA:  Shortness of breath.  EXAM:  CHEST  2 VIEW  COMPARISON:  01/28/2014  FINDINGS: Lungs are adequately inflated with continued prominence of the perihilar/ bibasilar markings likely representing mild interstitial edema. Possible small amount of right pleural fluid. Cardiomediastinal silhouette and remainder the exam is unchanged.  IMPRESSION: Stable findings suggesting mild CHF. Possible small amount of right pleural fluid.   Electronically Signed   By: Elberta Fortis M.D.   On: 02/06/2014 11:03   Dg Chest 2 View  01/28/2014   CLINICAL DATA:  Shortness of breath and wheezing.  EXAM: CHEST  2 VIEW  COMPARISON:  Chest radiograph performed 08/27/2013, and CT of the chest performed 10/11/2013  FINDINGS: Known pulmonary nodules are not well characterized on radiograph.  The lungs are well-aerated. Vascular congestion is noted, with increased interstitial markings, likely reflecting mild pulmonary edema. No pleural effusion or pneumothorax is seen.  The heart is normal in size; the mediastinal contour is within normal limits. No acute osseous abnormalities are seen.  IMPRESSION: Vascular congestion, with increased interstitial markings, raising concern for mild pulmonary edema. Known pulmonary nodules are not well characterized on radiograph.   Electronically Signed   By: Roanna Raider M.D.   On: 01/28/2014 02:54   Dg Foot Complete Right  02/01/2014   X-ray report weightbearing right foot  Fracture base fifth right metatarsal without displacement Other than the above noted fracture there are no further additional  fractures and or dislocations noted No emphysema noted Note increased soft tissue density noted  Radiographic impression: Fracture base fifth right  metatarsal  Dg Foot Complete Right  01/21/2014   X-ray report weightbearing right foot  Fracture base of fifth metatarsal without displacement Inferior calcaneal spur Increased soft tissue density noted on the lateral view  Radiographic impression: Fracture base of fifth metatarsal (Jones fracture)   EKG:  SR rate 96 ICLBBB nonspecific lateral ST segments   ASSESSMENT AND PLAN:   Diastolic CHF:  Difficult issue due to CRF.  Discussed sliding scale lasix with patient  Add isordil 10 q8 for pre load reduction.  Discussed right heart cath with patient to see where we stand with his filling pressures in light of his CRF.  He is agreeable.  Suspect he will do best with combination of hydralazine and nitrates along with as much lasix as he will tolerate without bumping Cr to over 3.0  Will try to arrange right heart for today HTN:  See above  hytrin ? Being used for prostate as well CRF:  Continue prograf and prednisone  Level pending  F/U with Jon Lowell Guitar  This is major issue driving diastolic CHF along with his LVH  Signed: Charlton Haws 02/09/2014, 9:17 AM

## 2014-02-09 NOTE — Progress Notes (Signed)
Assessment:  1 AKI hemodynamically mediated, r/o Prograf toxicity  2 Chronic renal allograft dysfunction  3 CHF   Plan:  1 Continue treatment to optimize dry weight to lower, but azotemia and rising creat problematic  2 Prograf level pending (drug-drug interaction on Diflucan)  3 RHC per Dr. Eden Emms   Subjective: Interval History: Still SOB  Objective: Vital signs in last 24 hours: Temp:  [97.2 F (36.2 C)-98.6 F (37 C)] 97.3 F (36.3 C) (06/10 0600) Pulse Rate:  [92-94] 92 (06/10 0600) Resp:  [18-20] 20 (06/10 0600) BP: (117-134)/(63-88) 125/78 mmHg (06/10 0600) SpO2:  [100 %] 100 % (06/10 0600) Weight:  [89 kg (196 lb 3.4 oz)] 89 kg (196 lb 3.4 oz) (06/10 0600) Weight change: -0.1 kg (-3.5 oz)  Intake/Output from previous day: 06/09 0701 - 06/10 0700 In: 960 [P.O.:960] Out: 1350 [Urine:1350] Intake/Output this shift: Total I/O In: 360 [P.O.:360] Out: 350 [Urine:350]  General appearance: alert and cooperative Chest wall: no tenderness GI: soft, non-tender; bowel sounds normal; no masses,  no organomegaly Extremities: edema tr to 1+  Lab Results:  Recent Labs  02/06/14 1005  WBC 9.3  HGB 9.9*  HCT 33.0*  PLT 244   BMET:  Recent Labs  02/08/14 0405 02/09/14 0535  NA 132* 135*  K 5.1 5.7*  CL 92* 94*  CO2 28 26  GLUCOSE 242* 250*  BUN 86* 94*  CREATININE 2.48* 2.64*  CALCIUM 8.6 8.7   No results found for this basename: PTH,  in the last 72 hours Iron Studies: No results found for this basename: IRON, TIBC, TRANSFERRIN, FERRITIN,  in the last 72 hours Studies/Results: Dg Chest 2 View  02/09/2014   CLINICAL DATA:  Shortness of breath.  EXAM: CHEST  2 VIEW  COMPARISON:  02/06/2014.  FINDINGS: Mediastinum and hilar structures stable. Stable cardiomegaly with mild pulmonary vascular prominence. Diffuse interstitial prominence with Kerley B-lines again noted. These findings are consistent with congestive heart failure and interstitial edema. No pleural  effusion or pneumothorax. No acute osseous abnormality .  IMPRESSION: Persistent changes of congestive heart failure and pulmonary interstitial edema.   Electronically Signed   By: Maisie Fus  Register   On: 02/09/2014 07:50    Scheduled: . allopurinol  100 mg Oral q morning - 10a  . aspirin EC  81 mg Oral Q M,W,F  . finasteride  5 mg Oral Daily  . fluconazole  400 mg Oral Daily  . furosemide  60 mg Oral BID  . heparin  5,000 Units Subcutaneous 3 times per day  . insulin aspart  0-5 Units Subcutaneous QHS  . insulin aspart  0-9 Units Subcutaneous TID WC  . insulin aspart  5 Units Subcutaneous TID WC  . insulin NPH Human  16 Units Subcutaneous BID AC & HS  . isosorbide dinitrate  10 mg Oral TID  . magnesium oxide  400 mg Oral Daily  . pantoprazole  40 mg Oral Daily  . predniSONE  40 mg Oral Q breakfast  . simvastatin  40 mg Oral q1800  . sodium chloride  3 mL Intravenous Q12H  . tacrolimus  0.5 mg Oral q morning - 10a   And  . tacrolimus  1 mg Oral QPM  . terazosin  10 mg Oral QPM  . cyanocobalamin  1,000 mcg Oral Daily    LOS: 3 days   Atticus Wedin C 02/09/2014,10:04 AM

## 2014-02-10 DIAGNOSIS — E1029 Type 1 diabetes mellitus with other diabetic kidney complication: Secondary | ICD-10-CM | POA: Diagnosis not present

## 2014-02-10 DIAGNOSIS — N179 Acute kidney failure, unspecified: Secondary | ICD-10-CM | POA: Diagnosis not present

## 2014-02-10 DIAGNOSIS — I5021 Acute systolic (congestive) heart failure: Secondary | ICD-10-CM | POA: Diagnosis not present

## 2014-02-10 DIAGNOSIS — I10 Essential (primary) hypertension: Secondary | ICD-10-CM | POA: Diagnosis not present

## 2014-02-10 DIAGNOSIS — N189 Chronic kidney disease, unspecified: Secondary | ICD-10-CM | POA: Diagnosis not present

## 2014-02-10 DIAGNOSIS — N184 Chronic kidney disease, stage 4 (severe): Secondary | ICD-10-CM

## 2014-02-10 DIAGNOSIS — Z940 Kidney transplant status: Secondary | ICD-10-CM | POA: Diagnosis not present

## 2014-02-10 LAB — BASIC METABOLIC PANEL
BUN: 96 mg/dL — ABNORMAL HIGH (ref 6–23)
CO2: 27 meq/L (ref 19–32)
CREATININE: 2.53 mg/dL — AB (ref 0.50–1.35)
Calcium: 9.2 mg/dL (ref 8.4–10.5)
Chloride: 91 mEq/L — ABNORMAL LOW (ref 96–112)
GFR calc Af Amer: 28 mL/min — ABNORMAL LOW (ref >90)
GFR calc non Af Amer: 24 mL/min — ABNORMAL LOW (ref >90)
Glucose, Bld: 121 mg/dL — ABNORMAL HIGH (ref 70–99)
Potassium: 5.4 mEq/L — ABNORMAL HIGH (ref 3.7–5.3)
Sodium: 134 mEq/L — ABNORMAL LOW (ref 137–147)

## 2014-02-10 LAB — GLUCOSE, CAPILLARY
GLUCOSE-CAPILLARY: 152 mg/dL — AB (ref 70–99)
Glucose-Capillary: 340 mg/dL — ABNORMAL HIGH (ref 70–99)
Glucose-Capillary: 115 mg/dL — ABNORMAL HIGH (ref 70–99)
Glucose-Capillary: 215 mg/dL — ABNORMAL HIGH (ref 70–99)

## 2014-02-10 MED ORDER — SODIUM POLYSTYRENE SULFONATE 15 GM/60ML PO SUSP
30.0000 g | Freq: Once | ORAL | Status: AC
  Administered 2014-02-10: 30 g via ORAL
  Filled 2014-02-10: qty 120

## 2014-02-10 MED ORDER — FUROSEMIDE 40 MG PO TABS
40.0000 mg | ORAL_TABLET | Freq: Two times a day (BID) | ORAL | Status: DC
  Administered 2014-02-10 – 2014-02-11 (×3): 40 mg via ORAL
  Filled 2014-02-10 (×4): qty 1

## 2014-02-10 MED ORDER — TACROLIMUS 0.5 MG PO CAPS: 0.5000 mg | ORAL_CAPSULE | Freq: Two times a day (BID) | ORAL | Status: DC

## 2014-02-10 MED ORDER — SODIUM POLYSTYRENE SULFONATE 15 GM/60ML PO SUSP
15.0000 g | Freq: Once | ORAL | Status: DC
  Filled 2014-02-10: qty 60

## 2014-02-10 NOTE — Progress Notes (Signed)
CRITICAL VALUE ALERT  Critical value received:  CBG- 427 (Nurse Tech notified Nurse)  Date of notification:  02/09/2014  Time of notification:  2315  Critical value read back:yes  Nurse who received alert:  Milta Deiters  MD notified (1st page):  Dr. Craige Cotta  Time of first page:  2300  MD notified (2nd page):n/a  Time of second page:n/a  Responding MD:  Dr.Kirby  Time MD responded:  2315

## 2014-02-10 NOTE — Progress Notes (Signed)
Assessment:  1 AKI hemodynamically mediated, plus Prograf toxicity(12.4)  2 Chronic renal allograft dysfunction  3 CHF w/ PCWP 14 and nl CO 4 Hyperkalemia Plan:  1 Continue treatment to optimize dry weight to lower, but azotemia and rising creat problematic  2 Hold Prograf for a day 3 ? Need for pulmonary or ID given dyspnea and recent cryptococcus history 4 Reduce furosemide  Subjective: Interval History: Still with SOB  Objective: Vital signs in last 24 hours: Temp:  [97.3 F (36.3 C)-98.3 F (36.8 C)] 98.3 F (36.8 C) (06/11 0428) Pulse Rate:  [76-105] 88 (06/11 0428) Resp:  [17-20] 18 (06/11 0428) BP: (97-129)/(51-83) 114/67 mmHg (06/11 0428) SpO2:  [95 %-100 %] 95 % (06/11 0428) Weight:  [87.9 kg (193 lb 12.6 oz)] 87.9 kg (193 lb 12.6 oz) (06/11 0428) Weight change: -1.1 kg (-2 lb 6.8 oz)  Intake/Output from previous day: 06/10 0701 - 06/11 0700 In: 513 [P.O.:510; I.V.:3] Out: 2551 [Urine:2550; Stool:1] Intake/Output this shift: Total I/O In: 240 [P.O.:240] Out: 125 [Urine:125]  exam unchanged, no edema  Lab Results: No results found for this basename: WBC, HGB, HCT, PLT,  in the last 72 hours BMET:  Recent Labs  02/08/14 0405 02/09/14 0535  NA 132* 135*  K 5.1 5.7*  CL 92* 94*  CO2 28 26  GLUCOSE 242* 250*  BUN 86* 94*  CREATININE 2.48* 2.64*  CALCIUM 8.6 8.7   No results found for this basename: PTH,  in the last 72 hours Iron Studies: No results found for this basename: IRON, TIBC, TRANSFERRIN, FERRITIN,  in the last 72 hours Studies/Results: Dg Chest 2 View  02/09/2014   CLINICAL DATA:  Shortness of breath.  EXAM: CHEST  2 VIEW  COMPARISON:  02/06/2014.  FINDINGS: Mediastinum and hilar structures stable. Stable cardiomegaly with mild pulmonary vascular prominence. Diffuse interstitial prominence with Kerley B-lines again noted. These findings are consistent with congestive heart failure and interstitial edema. No pleural effusion or pneumothorax. No  acute osseous abnormality .  IMPRESSION: Persistent changes of congestive heart failure and pulmonary interstitial edema.   Electronically Signed   By: Maisie Fus  Register   On: 02/09/2014 07:50   Scheduled: . allopurinol  100 mg Oral q morning - 10a  . aspirin  325 mg Oral Daily  . Chlorhexidine Gluconate Cloth  6 each Topical Q0600  . finasteride  5 mg Oral Daily  . fluconazole  400 mg Oral Daily  . furosemide  60 mg Oral BID  . heparin  5,000 Units Subcutaneous 3 times per day  . insulin aspart  0-5 Units Subcutaneous QHS  . insulin aspart  0-9 Units Subcutaneous TID WC  . insulin aspart  5 Units Subcutaneous TID WC  . insulin NPH Human  16 Units Subcutaneous BID AC & HS  . isosorbide dinitrate  10 mg Oral TID  . magnesium oxide  400 mg Oral Daily  . pantoprazole  40 mg Oral Daily  . predniSONE  40 mg Oral Q breakfast  . simvastatin  40 mg Oral q1800  . sodium chloride  3 mL Intravenous Q12H  . sodium polystyrene  15 g Oral Once  . [START ON 02/12/2014] tacrolimus  0.5 mg Oral BID  . terazosin  10 mg Oral QPM  . cyanocobalamin  1,000 mcg Oral Daily     LOS: 4 days   Jon Burns C 02/10/2014,9:40 AM

## 2014-02-10 NOTE — Progress Notes (Signed)
At around 2315,Notified hospitalist of elevated blood sugar of 427. MD explained due to patient not having insulin all day due to heart cath procedure, probably caused blood sugar to rise. Orders given to only administer the scheduled 16 units of NPH, no regular insulin and recheck CBG in the morning. Patient asymptomatic. CBG this morning was 340 in which he received 9units of regular insulin. Patient continues to have no discomfort and is not symptomatic. Will continue to monitor patient to end of shift. Notified oncoming nurse of the above in shift report. Nurse signing off at this time.

## 2014-02-10 NOTE — Plan of Care (Signed)
Problem: Phase I Progression Outcomes Goal: EF % per last Echo/documented,Core Reminder form on chart Outcome: Completed/Met Date Met:  02/10/14 Echo performed on  02/07/2014 Echo result: 25-30%

## 2014-02-10 NOTE — Progress Notes (Signed)
Patient Name: Jon Burns Date of Encounter: 02/10/2014  Active Problems:   Acute diastolic heart failure   History of renal transplantation   Dyspnea   Acute renal failure   CHF (congestive heart failure)    Patient Profile: 73 yo male w/ hx renal tx, DM, HTN, remote tobacco, HFpEF until this admit when echo showed EF 20-25%, w/ LVH. He was d/c on 06/01 after diuresis and admitted 06/07 w/ SOB.   SUBJECTIVE: Breathing some better.  OBJECTIVE Filed Vitals:   02/09/14 2054 02/09/14 2150 02/09/14 2348 02/10/14 0428  BP: 105/65 97/55 108/65 114/67  Pulse: 87 91 90 88  Temp: 98.2 F (36.8 C) 98 F (36.7 C) 98.1 F (36.7 C) 98.3 F (36.8 C)  TempSrc: Oral Oral Oral Oral  Resp: 20 18 18 18   Height:      Weight:    193 lb 12.6 oz (87.9 kg)  SpO2: 98% 100% 97% 95%    Intake/Output Summary (Last 24 hours) at 02/10/14 1214 Last data filed at 02/10/14 1039  Gross per 24 hour  Intake    293 ml  Output   2350 ml  Net  -2057 ml   Filed Weights   02/08/14 0634 02/09/14 0600 02/10/14 0428  Weight: 196 lb 6.9 oz (89.1 kg) 196 lb 3.4 oz (89 kg) 193 lb 12.6 oz (87.9 kg)    PHYSICAL EXAM General: Well developed, well nourished, male in no acute distress. Head: Normocephalic, atraumatic.  Neck: Supple without bruits, JVD slightly elevated. Lungs:  Resp regular and unlabored, decreased BS bilaterally. Heart: RRR, S1, S2, no S3, S4, or murmur; no rub. Abdomen: Soft, non-tender, non-distended, BS + x 4.  Extremities: No clubbing, cyanosis, no edema.  Neuro: Alert and oriented X 3. Moves all extremities spontaneously. Psych: Normal affect.  LABS: Basic Metabolic Panel: Recent Labs  02/09/14 0535 02/10/14 1037  NA 135* 134*  K 5.7* 5.4*  CL 94* 91*  CO2 26 27  GLUCOSE 250* 121*  BUN 94* 96*  CREATININE 2.64* 2.53*  CALCIUM 8.7 9.2   BNP: Pro B Natriuretic peptide (BNP)  Date/Time Value Ref Range Status  02/06/2014 10:05 AM 34741.0* 0 - 125 pg/mL Final    01/28/2014  2:41 AM 33416.0* 0 - 125 pg/mL Final    TELE: SR, no ectopy   Right heart cath: 02/09/2014 Procedural Findings:  Hemodynamics  RA 7 mmHg  RV 32 /8 mmHg  PA 32/20 mmHg  PCWP 14 mmHg  Oxygen saturations:  PA 63  AO 98  Cardiac Output (Fick) 6.01  Cardiac Index (Fick) 2.82  Cardiac output by thermodilution: 5.32  Pulmonary vascular resistance (PVR): 1.9 Woods units.  Final Conclusions:  1. Mildly elevated filling pressures with no significant pulmonary hypertension.  2. Normal cardiac output.   Radiology/Studies: Dg Chest 2 View 02/09/2014   CLINICAL DATA:  Shortness of breath.  EXAM: CHEST  2 VIEW  COMPARISON:  02/06/2014.  FINDINGS: Mediastinum and hilar structures stable. Stable cardiomegaly with mild pulmonary vascular prominence. Diffuse interstitial prominence with Kerley B-lines again noted. These findings are consistent with congestive heart failure and interstitial edema. No pleural effusion or pneumothorax. No acute osseous abnormality .  IMPRESSION: Persistent changes of congestive heart failure and pulmonary interstitial edema.   Electronically Signed   By: Maisie Fus  Register   On: 02/09/2014 07:50     Current Medications:  . allopurinol  100 mg Oral q morning - 10a  . aspirin  325 mg Oral Daily  .  Chlorhexidine Gluconate Cloth  6 each Topical Q0600  . finasteride  5 mg Oral Daily  . fluconazole  400 mg Oral Daily  . furosemide  40 mg Oral BID  . heparin  5,000 Units Subcutaneous 3 times per day  . insulin aspart  0-5 Units Subcutaneous QHS  . insulin aspart  0-9 Units Subcutaneous TID WC  . insulin aspart  5 Units Subcutaneous TID WC  . insulin NPH Human  16 Units Subcutaneous BID AC & HS  . isosorbide dinitrate  10 mg Oral TID  . magnesium oxide  400 mg Oral Daily  . pantoprazole  40 mg Oral Daily  . predniSONE  40 mg Oral Q breakfast  . simvastatin  40 mg Oral q1800  . sodium chloride  3 mL Intravenous Q12H  . sodium polystyrene  30 g Oral Once   . [START ON 02/12/2014] tacrolimus  0.5 mg Oral BID  . terazosin  10 mg Oral QPM  . cyanocobalamin  1,000 mcg Oral Daily      ASSESSMENT AND PLAN: Active Problems:   Acute systolic heart failure - now on oral Lasix, continue to follow I/O, weights, renal function. Will make early f/u appt w/ Dr. Purvis SheffieldKoneswaran. Labs next week. Also need intro into Heart Failure Clinic given Low EF. Needs ischemic w/u ASAP to exclude CAD as the etiology for systolic dysfunction.  Otherwise, per IM.    History of renal transplantation   Dyspnea   Acute on chronic renal failure  SignedTheodore Demark, Rhonda Barrett , PA-C 12:14 PM 02/10/2014

## 2014-02-10 NOTE — Progress Notes (Signed)
Inpatient Diabetes Program Recommendations  AACE/ADA: New Consensus Statement on Inpatient Glycemic Control (2013)  Target Ranges:  Prepandial:   less than 140 mg/dL      Peak postprandial:   less than 180 mg/dL (1-2 hours)      Critically ill patients:  140 - 180 mg/dL  Results for AVERETT, MAGGIO (MRN [id number]) as of 02/10/2014 10:33  Ref. Range 02/09/2014 06:01 02/09/2014 10:45 02/09/2014 17:39 02/09/2014 22:58 02/10/2014 04:36  Glucose-Capillary Latest Range: 70-99 mg/dL 590 (H) 931 (H) 121 (H) 427 (H) 340 (H)  Consider titrating pm NPH up to 20 units Thank you  Piedad Climes BSN, RN,CDE Inpatient Diabetes Coordinator [phone] (team pager)

## 2014-02-10 NOTE — Progress Notes (Signed)
Pharmacist Heart Failure Core Measure Documentation  Assessment: Jon Burns has an EF documented as 25-36% on 02/07/2014 by Dr. Shirlee Latch.  Rationale: Heart failure patients with left ventricular systolic dysfunction (LVSD) and an EF < 40% should be prescribed an angiotensin converting enzyme inhibitor (ACEI) or angiotensin receptor blocker (ARB) at discharge unless a contraindication is documented in the medical record.  This patient is not currently on an ACEI or ARB for HF.  This note is being placed in the record in order to provide documentation that a contraindication to the use of these agents is present for this encounter.  ACE Inhibitor or Angiotensin Receptor Blocker is contraindicated (specify all that apply)  []   ACEI allergy AND ARB allergy []   Angioedema []   Moderate or severe aortic stenosis [x]   Hyperkalemia []   Hypotension []   Renal artery stenosis [x]   Worsening renal function, preexisting renal disease or dysfunction   Jadis Pitter 02/10/2014 2:59 PM

## 2014-02-10 NOTE — Progress Notes (Signed)
[facility] PROGRESS NOTE Interim History: 73 y/o male with hx of diastolic heart failure, discharged on 5/31 after admission for CHF exacerbation at that time; has noticed increased SOB and LE swelling.  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  - On oral  Lasix, with Good UOP. - Cardiology recommended a RHC as below.  - Daily weight, low sodium diet, strict intake and output  - K is high, check a b-met single dose of kayexalate. - Patient feeling better and breathing better.   Acute on chronic renal failure: (stage 4 at baseline): - most likely secondary to CHF exacerbation  - renal has been consulted, she does have a transplant; will follow rec's  - will follow tacrolimus level  - renal function essentially stable by GFR evaluation; - follow levels now that lasix has been transitioned to PO. - ? When can the PT go home.  Hyperkalemia: - repeat B-met, give kayexalate.  History of renal transplantation: - follow renal recommendations  - continue tacrolimus  - continue prednisone  - renal service consulted   Diabetes Mellitus:  - A1C 7.6  - continue NPH BID now for better control  - continue also novolog 5 units for meal coverage   GERD: - continue PPI   HLD: continue statins   BPH: continue hytrin   Code Status: full  Family Communication: wife at bedside  Disposition Plan: to be determine  Consultants:  Renal service  Cardiology   Procedures:  ECHO: - Left ventricle: The cavity size was mildly dilated. Wall thickness was increased in a pattern of mild LVH.  Systolic function was severely reduced. The estimated ejection fraction was in the range of 25% to 30%. Diffuse hypokinesis. Features are consistent with a pseudonormal left ventricular filling pattern, with concomitant abnormal relaxation and increased filling pressure (grade 2 diastolic dysfunction). Aortic valve: There was no stenosis. Mitral valve: Mildly calcified annulus. There was no significant regurgitation. Left atrium: The atrium was moderately dilated.  Right ventricle: The cavity size was normal. Systolic function was mildly reduced. Pulmonary arteries: No complete TR doppler jet so unable to estimate PA systolic pressure. Systemic veins: IVC measured 2.4 cm with normal respirophasic variation, suggesting RA pressure 8 mmHg. Pericardium, extracardiac: A trivial pericardial effusion was identified.   [place] 6.10.2015:  - Mildly elevated filling pressures with no significant pulmonary hypertension.  - Normal cardiac output  Antibiotics:  None   HPI/Subjective: Wants to go home.  Objective: Filed Vitals:   02/09/14 2054 02/09/14 2150 02/09/14 2348 02/10/14 0428  BP: 105/65 97/55 108/65 114/67  Pulse: 87 91 90 88  Temp: 98.2 F (36.8 C) 98 F (36.7 C) 98.1 F (36.7 C) 98.3 F (36.8 C)  TempSrc: Oral Oral Oral Oral  Resp: $Remo'20 18 18 18  'wqZzD$ Height:      Weight:    87.9 kg (193 lb 12.6 oz)  SpO2: 98% 100% 97% 95%     Exam:  General: Alert, awake, oriented x3, in  no acute distress.  HEENT: No bruits, no goiter. -JVD Heart: Regular rate and rhythm. Lungs: Good air movement, bilateral air movement.  Abdomen: Soft, nontender, nondistended, positive bowel sounds.     Data Reviewed: Basic Metabolic Panel:  Recent Labs Lab 02/06/14 1005 02/07/14 0124 02/08/14 0405 02/09/14 0535  NA 132* 133* 132* 135*  K 5.4* 5.6* 5.1 5.7*  CL 91* 93* 92* 94*  CO2 $Re'24 27 28 26  'woT$ GLUCOSE 210* 327* 242* 250*  BUN 77* 82* 86* 94*  CREATININE 2.51* 2.42* 2.48* 2.64*  CALCIUM 9.1 8.8 8.6 8.7    Liver Function Tests: No results found for this basename: AST, ALT, ALKPHOS, BILITOT, PROT, ALBUMIN,  in the last 168 hours No results found for this basename: LIPASE, AMYLASE,  in the last 168 hours No results found for this basename: AMMONIA,  in the last 168 hours CBC:  Recent Labs Lab 02/06/14 1005  WBC 9.3  HGB 9.9*  HCT 33.0*  MCV 78.4  PLT 244   Cardiac Enzymes:  Recent Labs Lab 02/06/14 1413 02/06/14 2005 02/07/14 0124  TROPONINI <0.30 <0.30 <0.30   BNP (last 3 results)  Recent Labs  08/26/13 0452 01/28/14 0241 02/06/14 1005  PROBNP 3270.0* 33416.0* 34741.0*   CBG:  Recent Labs Lab 02/09/14 0601 02/09/14 1045 02/09/14 1739 02/09/14 2258 02/10/14 0436  GLUCAP 245* 158* 276* 427* 340*    No results found for this or any previous visit (from the past 240 hour(s)).   Studies: Dg Chest 2 View  02/09/2014   CLINICAL DATA:  Shortness of breath.  EXAM: CHEST  2 VIEW  COMPARISON:  02/06/2014.  FINDINGS: Mediastinum and hilar structures stable. Stable cardiomegaly with mild pulmonary vascular prominence. Diffuse interstitial prominence with Kerley B-lines again noted. These findings are consistent with congestive heart failure and interstitial edema. No pleural effusion or pneumothorax. No acute osseous abnormality .  IMPRESSION: Persistent changes of congestive heart failure and pulmonary interstitial edema.   Electronically Signed   By: Marcello Moores  Register   On: 02/09/2014 07:50    Scheduled Meds: . allopurinol  100 mg Oral q morning - 10a  . aspirin  325 mg Oral Daily  . Chlorhexidine Gluconate Cloth  6 each Topical Q0600  . finasteride  5 mg Oral Daily  . fluconazole  400 mg Oral Daily  . furosemide  60 mg Oral BID  . heparin  5,000 Units Subcutaneous 3 times per day  . insulin aspart  0-5 Units Subcutaneous QHS  . insulin aspart  0-9 Units Subcutaneous TID WC  . insulin aspart  5 Units Subcutaneous TID WC  . insulin NPH Human  16 Units Subcutaneous  BID AC & HS  . isosorbide dinitrate  10 mg Oral TID  . magnesium oxide  400 mg Oral Daily  . pantoprazole  40 mg Oral Daily  . predniSONE  40 mg Oral Q breakfast  . simvastatin  40 mg Oral q1800  . sodium chloride  3 mL Intravenous Q12H  . sodium polystyrene  15 g Oral Once  . [START ON 02/12/2014] tacrolimus  0.5 mg Oral BID  . terazosin  10 mg Oral QPM  . cyanocobalamin  1,000 mcg Oral Daily   Continuous Infusions:    Charlynne Cousins  [facility] Pager [phone] If 8PM-8AM, please contact night-coverage at [email or web address], password [facility] 02/10/2014, 10:32 AM  LOS: 4 days   **Disclaimer: This note may have been dictated with voice recognition software. Similar sounding words can inadvertently be transcribed  and this note may contain transcription errors which may not have been corrected upon publication of note.**

## 2014-02-10 NOTE — Progress Notes (Addendum)
       Patient Name: Jon Burns Date of Encounter: 02/10/2014    SUBJECTIVE:The patient is short of breath. Since last Echo in 12/14, there is dramatic drop in EF from 50% to 25%. Denies chest discomfort/indigestion.  TELEMETRY:  NSR Filed Vitals:   02/09/14 2054 02/09/14 2150 02/09/14 2348 02/10/14 0428  BP: 105/65 97/55 108/65 114/67  Pulse: 87 91 90 88  Temp: 98.2 F (36.8 C) 98 F (36.7 C) 98.1 F (36.7 C) 98.3 F (36.8 C)  TempSrc: Oral Oral Oral Oral  Resp: 20 18 18 18   Height:      Weight:    193 lb 12.6 oz (87.9 kg)  SpO2: 98% 100% 97% 95%    Intake/Output Summary (Last 24 hours) at 02/10/14 1231 Last data filed at 02/10/14 1039  Gross per 24 hour  Intake    293 ml  Output   2350 ml  Net  -2057 ml   LABS: Basic Metabolic Panel:  Recent Labs  37/85/88 0535 02/10/14 1037  NA 135* 134*  K 5.7* 5.4*  CL 94* 91*  CO2 26 27  GLUCOSE 250* 121*  BUN 94* 96*  CREATININE 2.64* 2.53*  CALCIUM 8.7 9.2   BNP    Component Value Date/Time   PROBNP 34741.0* 02/06/2014 1005    Radiology/Studies:  CXR 02/09/14 IMPRESSION:  Persistent changes of congestive heart failure and pulmonary  interstitial edema.   Physical Exam: Blood pressure 114/67, pulse 88, temperature 98.3 F (36.8 C), temperature source Oral, resp. rate 18, height 6\' 1"  (1.854 m), weight 193 lb 12.6 oz (87.9 kg), SpO2 95.00%. Weight change: -2 lb 6.8 oz (-1.1 kg)  Wt Readings from Last 3 Encounters:  02/10/14 193 lb 12.6 oz (87.9 kg)  02/10/14 193 lb 12.6 oz (87.9 kg)  01/30/14 202 lb 9.6 oz (91.9 kg)   Flate neck veins Chest is clear Soft intermittent S3 gallop No edema  ASSESSMENT:  1. New (acute) systolic heart failure with EF drop from 55 to 25% since 08/2013. 2. Significant azotemia due to steroids, diuresis, and low cardiac output (although Fick CO at cath estimated at 6.01 L/Min). PCWP 14 mmHg 3. Acute on chronic Kidney Disease in transplant patient, progressive over the past  6-8 months. 4. Diabetes mellitus 5. Hyperkalemia should be resolved prior to discharge.  Plan:  Needs ischemic evaluation ASAP to exclude CAD/ischemia as explanation for new drop in EF. Needs to be done as Lexiscan.  Needs to be associated with the heart failure clinic due to frequent re-admissions and new systolic HF.  I am very concerned about his prognosis.    Selinda Eon 02/10/2014, 12:31 PM

## 2014-02-11 ENCOUNTER — Inpatient Hospital Stay (HOSPITAL_COMMUNITY): Payer: Non-veteran care

## 2014-02-11 ENCOUNTER — Other Ambulatory Visit: Payer: Self-pay | Admitting: Physician Assistant

## 2014-02-11 DIAGNOSIS — N189 Chronic kidney disease, unspecified: Secondary | ICD-10-CM | POA: Diagnosis not present

## 2014-02-11 DIAGNOSIS — E1029 Type 1 diabetes mellitus with other diabetic kidney complication: Secondary | ICD-10-CM | POA: Diagnosis not present

## 2014-02-11 DIAGNOSIS — N179 Acute kidney failure, unspecified: Secondary | ICD-10-CM | POA: Diagnosis not present

## 2014-02-11 DIAGNOSIS — I519 Heart disease, unspecified: Secondary | ICD-10-CM

## 2014-02-11 DIAGNOSIS — I5021 Acute systolic (congestive) heart failure: Secondary | ICD-10-CM | POA: Diagnosis not present

## 2014-02-11 DIAGNOSIS — I5189 Other ill-defined heart diseases: Secondary | ICD-10-CM

## 2014-02-11 LAB — BASIC METABOLIC PANEL
BUN: 91 mg/dL — AB (ref 6–23)
CHLORIDE: 98 meq/L (ref 96–112)
CO2: 30 meq/L (ref 19–32)
CREATININE: 2.32 mg/dL — AB (ref 0.50–1.35)
Calcium: 9 mg/dL (ref 8.4–10.5)
GFR calc Af Amer: 31 mL/min — ABNORMAL LOW (ref >90)
GFR calc non Af Amer: 27 mL/min — ABNORMAL LOW (ref >90)
Glucose, Bld: 155 mg/dL — ABNORMAL HIGH (ref 70–99)
Potassium: 4.9 mEq/L (ref 3.7–5.3)
Sodium: 139 mEq/L (ref 137–147)

## 2014-02-11 LAB — GLUCOSE, CAPILLARY
GLUCOSE-CAPILLARY: 281 mg/dL — AB (ref 70–99)
Glucose-Capillary: 150 mg/dL — ABNORMAL HIGH (ref 70–99)
Glucose-Capillary: 283 mg/dL — ABNORMAL HIGH (ref 70–99)

## 2014-02-11 MED ORDER — TACROLIMUS 0.5 MG PO CAPS: 0.5000 mg | ORAL_CAPSULE | Freq: Two times a day (BID) | ORAL | Status: DC

## 2014-02-11 MED ORDER — CARVEDILOL 3.125 MG PO TABS: 3.1250 mg | ORAL_TABLET | Freq: Two times a day (BID) | ORAL | Status: DC

## 2014-02-11 MED ORDER — CARVEDILOL 3.125 MG PO TABS
3.1250 mg | ORAL_TABLET | Freq: Two times a day (BID) | ORAL | Status: DC
  Administered 2014-02-11: 3.125 mg via ORAL
  Filled 2014-02-11 (×3): qty 1

## 2014-02-11 NOTE — Discharge Instructions (Signed)
You have a Stress Test scheduled at   No food after midnight before. Can have clear liquids such as water or broth up to 4 hours before. No caffeine/decaf products 24hr before, including meds such as Excedrin or Goody-Powders. Call if there are any questions. OK to take am meds with a sip of water. Arrive about 15 min early for paperwork. Wear comfortable clothes and shoes.  Do NOT take: Beta blockers such as metoprolol/lopressor/Toprol XL or calcium channel blockers such as cardizem/Diltiazem or verapmil/Calan for 24 hours before the test.  Remove nitroglycerin patches and do not take nitrate preparations such as Imdur/isosorbide. No Persantine/Theophylline or Aggrenox meds should be used within 24 hours of the test. Discuss with MD what to do about diabetes meds if you take these.  When you arrive in the lab, the technician will inject a small amount of radioactive tracer. After a waiting period, resting pictures will be obtained.   You will be prepped for the stress portion of the test. With the stress (medical or treadmill), another small amount of radioactive tracer will be injected.  You will get a second set of pictures after a waiting period.   The whole test will take several hours.

## 2014-02-11 NOTE — Progress Notes (Signed)
Heart Failure Navigator Consult Note  Presentation: Jon Burns  is a 73 y.o. 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 Patient had recently followed up with his nephrologist at [facility] and at that time was advised to increase his Lasix to 60 mg by mouth twice a day but hold if blood pressure decreases. Last Monday patient increase his dose of Lasix to 60 mg twice a day following which after one day his blood pressure dropped to the 90s. He decrease his Lasix back to his home dose of 40 mg twice a day. Patient has been gradually getting short of breath since then. In the ER he was initially placed on CPAP and was given Lasix total of 80 mg IV. Patient's labs show elevated creatinine from his baseline. Labs done at [facility] 5 days ago shows creatinine around 2.3 and today it is around 2.8. Patient last week also had sustained fracture of his right foot and is on cast. Patient denies any nausea vomiting abdominal pain diarrhea chest pain productive cough   Past Medical History  Diagnosis Date  . Hypertension   . CHF (congestive heart failure)   . Hypercholesteremia   . Heart murmur   . Pneumonia 2013  . Shortness of breath     "can come on at anytime lately; sometimes related to his CHF" (08/27/2013)  . Sleep apnea     "positively has it but never tested"/daughter @ bedside (08/27/2013)  . Type II diabetes mellitus   . GERD (gastroesophageal reflux disease)   . Gout   . Renal disorder     R kidney transplant 05/14/10    History   Social History  . Marital Status: Married    Spouse Name: N/A    Number of Children: N/A  . Years of Education: N/A   Social History Main Topics  . Smoking status: Former Smoker -- 1.50 packs/day for 40 years    Types: Cigarettes    Quit date: 09/02/1992  . Smokeless  tobacco: Never Used  . Alcohol Use: No  . Drug Use: No  . Sexual Activity: No   Other Topics Concern  . None   Social History Narrative  . None    ECHO:Study Conclusions--02/07/14  - Left ventricle: The cavity size was mildly dilated. Wall thickness was increased in a pattern of mild LVH. Systolic function was severely reduced. The estimated ejection fraction was in the range of 25% to 30%. Diffuse hypokinesis. Features are consistent with a pseudonormal left ventricular filling pattern, with concomitant abnormal relaxation and increased filling pressure (grade 2 diastolic dysfunction). - Aortic valve: There was no stenosis. - Mitral valve: Mildly calcified annulus. There was no significant regurgitation. - Left atrium: The atrium was moderately dilated. - Right ventricle: The cavity size was normal. Systolic function was mildly reduced. - Pulmonary arteries: No complete TR doppler jet so unable to estimate PA systolic pressure. - Systemic veins: IVC measured 2.4 cm with normal respirophasic variation, suggesting RA pressure 8 mmHg. - Pericardium, extracardiac: A trivial pericardial effusion was identified.  Impressions:  - Mildly dilated LV with mild LV hypertrophy. EF 25-30%, diffuse hypokinesis with some regional variation. Normal RV size wtih mildly decreased systolic function. No significant valvular abnormalities.    BNP    Component Value Date/Time   PROBNP 34741.0* 02/06/2014 1005    Education  Assessment and Provision:  Detailed education and instructions provided on heart failure disease management including the following:  Signs and symptoms of Heart Failure When to call the physician Importance of daily weights Low sodium diet Fluid restriction Medication management Anticipated future follow-up appointments  Patient education given on each of the above topics.  Patient acknowledges understanding and acceptance of all instructions.  Mr. Jon Burns lives  with hs wife in [place].  He currently weighs daily and says that his weight fluctuates greatly at home- which he attributes to his scales "not working right".  He says that he was just in the hospital and did well for a few days and then became very SOB again--so he came "here".  He does admit to dietary indiscretion however he says that now he will be eating only the meals that his wife cooks.  Per the patient his wife is [id number] and in remarkably good health saying she runs daily--7 days a week.  Education Materials:  "Living Better With Heart Failure" Booklet, Daily Weight Tracker Tool   High Risk Criteria for Readmission and/or Poor Patient Outcomes:  (Recommend Follow-up with Advanced Heart Failure Clinic)--Yes   EF <30%- Yes--20-25%--down from previous echo  2 or more admissions in 6 months- Yes  Difficult social situation- No  Demonstrates medication noncompliance- No   Barriers of Care:  Knowledge of HF and compliance  Discharge Planning:   Plans to discharge to home with his wife.  He has an appointment with the AHF clinic on 02/16/14 at 2:15pm.

## 2014-02-11 NOTE — Progress Notes (Signed)
We should get him set for a Lexiscan myocardial perfusion study as an OP. F/U is CHF clinic. Already on empiric LA nitrate therapy and not ACE/ARB candidate due to renal disease. Needs low dose beta blocker.

## 2014-02-11 NOTE — Progress Notes (Signed)
Patient Name: Jon Burns Date of Encounter: 02/11/2014  Active Problems:   Acute systolic heart failure   History of renal transplantation   Dyspnea   Acute renal failure    Patient Profile: 73 yo male w/ hx renal tx, DM, HTN, remote tobacco, HFpEF until this admit when echo showed EF 20-25%, w/ LVH. He was d/c on 06/01 after diuresis and admitted 06/07 w/ SOB.    SUBJECTIVE: Has been ambulating without difficulty, feels SOB is at baseline.  OBJECTIVE Filed Vitals:   02/10/14 1746 02/10/14 2156 02/11/14 0500 02/11/14 0648  BP: 110/60 100/60  110/68  Pulse:  80  83  Temp:  98.4 F (36.9 C)  98 F (36.7 C)  TempSrc:  Oral  Oral  Resp:  20  18  Height:      Weight:   188 lb 1.6 oz (85.322 kg)   SpO2:  100%  98%    Intake/Output Summary (Last 24 hours) at 02/11/14 1002 Last data filed at 02/11/14 0918  Gross per 24 hour  Intake    720 ml  Output   3125 ml  Net  -2405 ml   Filed Weights   02/09/14 0600 02/10/14 0428 02/11/14 0500  Weight: 196 lb 3.4 oz (89 kg) 193 lb 12.6 oz (87.9 kg) 188 lb 1.6 oz (85.322 kg)    PHYSICAL EXAM General: Well developed, well nourished, male in no acute distress. Head: Normocephalic, atraumatic.  Neck: Supple without bruits, JVD not elevated. Lungs:  Resp regular and unlabored, decreased BS bases, right slightly > left. Heart: RRR, S1, S2, no S3, S4, or murmur; no rub. Abdomen: Soft, non-tender, non-distended, BS + x 4.  Extremities: No clubbing, cyanosis, no edema.  Neuro: Alert and oriented X 3. Moves all extremities spontaneously. Psych: Normal affect.  LABS: Basic Metabolic Panel: Recent Labs  02/10/14 1037 02/11/14 0611  NA 134* 139  K 5.4* 4.9  CL 91* 98  CO2 27 30  GLUCOSE 121* 155*  BUN 96* 91*  CREATININE 2.53* 2.32*  CALCIUM 9.2 9.0   BNP: Pro B Natriuretic peptide (BNP)  Date/Time Value Ref Range Status  02/06/2014 10:05 AM 34741.0* 0 - 125 pg/mL Final  01/28/2014  2:41 AM 33416.0* 0 - 125 pg/mL  Final    TELE:   Not on   Current Medications:  . allopurinol  100 mg Oral q morning - 10a  . aspirin  325 mg Oral Daily  . Chlorhexidine Gluconate Cloth  6 each Topical Q0600  . finasteride  5 mg Oral Daily  . fluconazole  400 mg Oral Daily  . furosemide  40 mg Oral BID  . heparin  5,000 Units Subcutaneous 3 times per day  . insulin aspart  0-5 Units Subcutaneous QHS  . insulin aspart  0-9 Units Subcutaneous TID WC  . insulin aspart  5 Units Subcutaneous TID WC  . insulin NPH Human  16 Units Subcutaneous BID AC & HS  . isosorbide dinitrate  10 mg Oral TID  . magnesium oxide  400 mg Oral Daily  . pantoprazole  40 mg Oral Daily  . predniSONE  40 mg Oral Q breakfast  . simvastatin  40 mg Oral q1800  . sodium chloride  3 mL Intravenous Q12H  . [START ON 02/12/2014] tacrolimus  0.5 mg Oral BID  . terazosin  10 mg Oral QPM  . cyanocobalamin  1,000 mcg Oral Daily    ASSESSMENT AND PLAN: Active Problems:   Acute systolic  heart failure - Renal following, defer diuretic dose to them. Has appt at CHF clinic next week.    History of renal transplantation - per IM/Renal teams    Dyspnea - see above    Acute renal failure - mgt per Renal team, BUN/Cr improving.     Hyperkalemia - improved, per IM/Renal   SignedTheodore Demark, Jos Cygan , PA-C 10:02 AM 02/11/2014

## 2014-02-11 NOTE — Progress Notes (Signed)
Assessment:  1 AKI hemodynamically mediated, plus Prograf toxicity(12.4) w/ Diflucan-Tacrolimus interaction 2 Chronic renal allograft dysfunction  3 CHF w/ PCWP 14 and nl CO, EF 25% 4 Hyperkalemia, improved  Plan:  1 Continue treatment on higher diuretic dose 2 Hold Prograf today and lower discharge dose  3 Per Cardiology 4 CXR  Subjective: Interval History: good diuresis, still DOE  Objective: Vital signs in last 24 hours: Temp:  [97.9 F (36.6 C)-99.3 F (37.4 C)] 98 F (36.7 C) (06/12 0648) Pulse Rate:  [78-87] 83 (06/12 0648) Resp:  [18-21] 18 (06/12 0648) BP: (92-115)/(56-73) 110/68 mmHg (06/12 0648) SpO2:  [97 %-100 %] 98 % (06/12 0648) Weight:  [85.322 kg (188 lb 1.6 oz)] 85.322 kg (188 lb 1.6 oz) (06/12 0500) Weight change: -2.578 kg (-5 lb 11 oz)  Intake/Output from previous day: 06/11 0701 - 06/12 0700 In: 720 [P.O.:720] Out: 2975 [Urine:2975] Intake/Output this shift: Total I/O In: 240 [P.O.:240] Out: 275 [Urine:275]  General appearance: alert and cooperative GI: soft, non-tender; bowel sounds normal; no masses,  no organomegaly Extremities: extremities normal, atraumatic, no cyanosis or edema  Lab Results: No results found for this basename: WBC, HGB, HCT, PLT,  in the last 72 hours BMET:  Recent Labs  02/10/14 1037 02/11/14 0611  NA 134* 139  K 5.4* 4.9  CL 91* 98  CO2 27 30  GLUCOSE 121* 155*  BUN 96* 91*  CREATININE 2.53* 2.32*  CALCIUM 9.2 9.0   No results found for this basename: PTH,  in the last 72 hours Iron Studies: No results found for this basename: IRON, TIBC, TRANSFERRIN, FERRITIN,  in the last 72 hours Studies/Results: No results found.  Scheduled: . allopurinol  100 mg Oral q morning - 10a  . aspirin  325 mg Oral Daily  . Chlorhexidine Gluconate Cloth  6 each Topical Q0600  . finasteride  5 mg Oral Daily  . fluconazole  400 mg Oral Daily  . furosemide  40 mg Oral BID  . heparin  5,000 Units Subcutaneous 3 times per day  .  insulin aspart  0-5 Units Subcutaneous QHS  . insulin aspart  0-9 Units Subcutaneous TID WC  . insulin aspart  5 Units Subcutaneous TID WC  . insulin NPH Human  16 Units Subcutaneous BID AC & HS  . isosorbide dinitrate  10 mg Oral TID  . magnesium oxide  400 mg Oral Daily  . pantoprazole  40 mg Oral Daily  . predniSONE  40 mg Oral Q breakfast  . simvastatin  40 mg Oral q1800  . sodium chloride  3 mL Intravenous Q12H  . [START ON 02/12/2014] tacrolimus  0.5 mg Oral BID  . terazosin  10 mg Oral QPM  . cyanocobalamin  1,000 mcg Oral Daily     LOS: 5 days   Gentry Seeber C 02/11/2014,9:19 AM

## 2014-02-11 NOTE — Discharge Summary (Addendum)
Physician Discharge Summary  Jon Burns ZOX:[id number] DOB: [birth year] DOA: 02/06/2014  PCP: Suzan Garibaldi, FNP  Admit date: 02/06/2014 Discharge date: 02/11/2014  Time spent: 40 minutes  Recommendations for Outpatient Follow-up:  1. Follow up with Junie Bame at the heart failure clinic.check a b-met and titrate medications as tolerate it. 2. Stress test as an outpatient.  BNP    Component Value Date/Time   PROBNP 34741.0* 02/06/2014 1005   Filed Weights   02/09/14 0600 02/10/14 0428 02/11/14 0500  Weight: 89 kg (196 lb 3.4 oz) 87.9 kg (193 lb 12.6 oz) 85.322 kg (188 lb 1.6 oz)     Discharge Diagnoses:  Active Problems:   Acute systolic heart failure   History of renal transplantation   Dyspnea   Acute renal failure   Discharge Condition: stable  Diet recommendation: low sodium    History of present illness:  73 y/o male with hx of diastolic heart failure, discharged on 5/31 after admission for CHF exacerbation at that time; has noticed increased SOB and LE swelling.   Hospital Course:  Acute on chronic combined CHF (congestive heart failure):  - Last 2-D echo with EF 50-55% on 08/2013. Repeated ECHo as below showed EF 25%. - Started on IV lasix with good diuresis. Consulted cardiology, - Cardiology recommended a RHC as below and stress test as an outpatient. - Changed to oral Lasix, with Good UOP.  - Patient feeling better and breathing better.  - follow up with the heart failure clinic in 1 week.  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 - renal function essentially stable by GFR evaluation;  - continue tacrolimus at a lower dose at home. - continue prednisone.  Hyperkalemia:  - repeat B-met, give kayexalate.  - now resolved.  Diabetes Mellitus:  - A1C 7.6  - continue  NPH BID now for better control  - continue also novolog 5 units for meal coverage   GERD:  - continue PPI   Procedures: ECHO: - Left ventricle: The cavity size was mildly dilated. Wall thickness was increased in a pattern of mild LVH. Systolic function was severely reduced. The estimated ejection fraction was in the range of 25% to 30%. Diffuse hypokinesis. Features are consistent with a pseudonormal left ventricular filling pattern, with concomitant abnormal relaxation and increased filling pressure (grade 2 diastolic dysfunction). Left atrium: The atrium was moderately dilated. Right ventricle: The cavity size was normal. Systolic function was mildly reduced. Pulmonary arteries: No complete TR doppler jet so unable to estimate PA systolic pressure. Systemic veins: IVC measured 2.4 cm with normal respirophasic variation, suggesting RA pressure 8 mmHg. Pericardium, extracardiac: A trivial pericardial effusion was identified.   [place] 6.10.2015:  - Mildly elevated filling pressures with no significant pulmonary hypertension.  - Normal cardiac output  Consultations:  Cardiology  renal  Discharge Exam: Filed Vitals:   02/11/14 0648  BP: 110/68  Pulse: 83  Temp: 98 F (36.7 C)  Resp: 18    General: A&O x3 Cardiovascular: RRR Respiratory: good air movement  Discharge Instructions      Discharge Instructions   Diet - low sodium heart healthy    Complete by:  As directed      Increase activity slowly    Complete by:  As directed  Medication List         allopurinol 100 MG tablet  Commonly known as:  ZYLOPRIM  Take 100 mg by mouth every morning.     aspirin EC 81 MG tablet  Take 81 mg by mouth every Monday, Wednesday, and Friday.     CALTRATE 600+D PO  Take 1 tablet by mouth 2 (two) times daily.     carvedilol 3.125 MG tablet  Commonly known as:  COREG  Take 1 tablet (3.125 mg total) by mouth 2 (two) times daily with a meal.     cyanocobalamin 1000 MCG  tablet  Take 1,000 mcg by mouth daily.     finasteride 5 MG tablet  Commonly known as:  PROSCAR  Take 5 mg by mouth daily.     fluconazole 200 MG tablet  Commonly known as:  DIFLUCAN  Take 400 mg by mouth daily.     furosemide 20 MG tablet  Commonly known as:  LASIX  Take 40 mg by mouth 2 (two) times daily.     glucagon 1 MG injection  Inject 1 mg into the vein once as needed.     HYDROcodone-acetaminophen 10-325 MG per tablet  Commonly known as:  NORCO  Take 1 tablet by mouth every 6 (six) hours as needed for moderate pain.     insulin NPH Human 100 UNIT/ML injection  Commonly known as:  HUMULIN N,NOVOLIN N  Inject 22 Units into the skin at bedtime.     insulin regular 100 units/mL injection  Commonly known as:  NOVOLIN R,HUMULIN R  Inject 12 Units into the skin 3 (three) times daily before meals.     magnesium oxide 400 MG tablet  Commonly known as:  MAG-OX  Take 400 mg by mouth daily.     omeprazole 20 MG capsule  Commonly known as:  PRILOSEC  Take 20 mg by mouth daily.     pravastatin 80 MG tablet  Commonly known as:  PRAVACHOL  Take 80 mg by mouth daily.     predniSONE 5 MG tablet  Commonly known as:  DELTASONE  Take 2.5-5 mg by mouth 2 (two) times daily. Take 5 mg in the morning and 2.5 mg at bedtime     tacrolimus 0.5 MG capsule  Commonly known as:  PROGRAF  Take 1 capsule (0.5 mg total) by mouth 2 (two) times daily.  Start taking on:  02/12/2014     terazosin 10 MG capsule  Commonly known as:  HYTRIN  Take 10 mg by mouth every evening.     traMADol 50 MG tablet  Commonly known as:  ULTRAM  Take by mouth every 6 (six) hours as needed for moderate pain.       No Known Allergies Follow-up Information   Follow up with Rande Brunt, NP On 02/16/2014. (at 2:15 pm. Please arrive 15 minutes early for paperwork.  [facility]-- Code to get in Gate 0400)    Specialty:  Nurse Practitioner   Contact information:   [address] [place] [address] [phone]       Follow up with [facility] On 02/23/2014. (Stress test at 9:15 am, see instructions. Call [phone] for questions.)    Contact information:   [place] [phone]       Follow up with Suzan Garibaldi, FNP.   Specialty:  Nurse Practitioner   Contact information:   [place] [address] [phone]  The results of significant diagnostics from this hospitalization (including imaging, microbiology, ancillary and laboratory) are listed below for reference.    Significant Diagnostic Studies: Dg Chest 2 View  02/11/2014   CLINICAL DATA:  Evaluate for CHF. History of cryptococcal pneumonia. No chest pain or shortness of breath.  EXAM: CHEST  2 VIEW  COMPARISON:  02/09/2014  FINDINGS: The lungs/of the heart is upper limits normal in size. There has been some improvement in aeration of the lung bases with interstitial prominence remaining. Perihilar peribronchial thickening is again identified. No new consolidations or pleural effusions identified.  IMPRESSION: Improvement in aeration at the bases.   Electronically Signed   By: Shon Hale M.D.   On: 02/11/2014 10:15   Dg Chest 2 View  02/09/2014   CLINICAL DATA:  Shortness of breath.  EXAM: CHEST  2 VIEW  COMPARISON:  02/06/2014.  FINDINGS: Mediastinum and hilar structures stable. Stable cardiomegaly with mild pulmonary vascular prominence. Diffuse interstitial prominence with Kerley B-lines again noted. These findings are consistent with congestive heart failure and interstitial edema. No pleural effusion or pneumothorax. No acute osseous abnormality .  IMPRESSION: Persistent changes of congestive heart failure and pulmonary interstitial edema.   Electronically Signed   By: Marcello Moores  Register   On: 02/09/2014 07:50   Dg Chest 2 View  02/06/2014   CLINICAL DATA:  Shortness of breath.  EXAM: CHEST  2 VIEW  COMPARISON:  01/28/2014  FINDINGS:  Lungs are adequately inflated with continued prominence of the perihilar/ bibasilar markings likely representing mild interstitial edema. Possible small amount of right pleural fluid. Cardiomediastinal silhouette and remainder the exam is unchanged.  IMPRESSION: Stable findings suggesting mild CHF. Possible small amount of right pleural fluid.   Electronically Signed   By: Marin Olp M.D.   On: 02/06/2014 11:03   Dg Chest 2 View  01/28/2014   CLINICAL DATA:  Shortness of breath and wheezing.  EXAM: CHEST  2 VIEW  COMPARISON:  Chest radiograph performed 08/27/2013, and CT of the chest performed 10/11/2013  FINDINGS: Known pulmonary nodules are not well characterized on radiograph.  The lungs are well-aerated. Vascular congestion is noted, with increased interstitial markings, likely reflecting mild pulmonary edema. No pleural effusion or pneumothorax is seen.  The heart is normal in size; the mediastinal contour is within normal limits. No acute osseous abnormalities are seen.  IMPRESSION: Vascular congestion, with increased interstitial markings, raising concern for mild pulmonary edema. Known pulmonary nodules are not well characterized on radiograph.   Electronically Signed   By: Garald Balding M.D.   On: 01/28/2014 02:54   Dg Foot Complete Right  02/01/2014   X-ray report weightbearing right foot  Fracture base fifth right metatarsal without displacement Other than the above noted fracture there are no further additional  fractures and or dislocations noted No emphysema noted Note increased soft tissue density noted  Radiographic impression: Fracture base fifth right metatarsal  Dg Foot Complete Right  01/21/2014   X-ray report weightbearing right foot  Fracture base of fifth metatarsal without displacement Inferior calcaneal spur Increased soft tissue density noted on the lateral view  Radiographic impression: Fracture base of fifth metatarsal (Jones fracture)   Microbiology: No results found for  this or any previous visit (from the past 240 hour(s)).   Labs: Basic Metabolic Panel:  Recent Labs Lab 02/07/14 0124 02/08/14 0405 02/09/14 0535 02/10/14 1037 02/11/14 0611  NA 133* 132* 135* 134* 139  K 5.6* 5.1 5.7* 5.4* 4.9  CL 93*  92* 94* 91* 98  CO2 $Re'27 28 26 27 30  'Cum$ GLUCOSE 327* 242* 250* 121* 155*  BUN 82* 86* 94* 96* 91*  CREATININE 2.42* 2.48* 2.64* 2.53* 2.32*  CALCIUM 8.8 8.6 8.7 9.2 9.0   Liver Function Tests: No results found for this basename: AST, ALT, ALKPHOS, BILITOT, PROT, ALBUMIN,  in the last 168 hours No results found for this basename: LIPASE, AMYLASE,  in the last 168 hours No results found for this basename: AMMONIA,  in the last 168 hours CBC:  Recent Labs Lab 02/06/14 1005  WBC 9.3  HGB 9.9*  HCT 33.0*  MCV 78.4  PLT 244   Cardiac Enzymes:  Recent Labs Lab 02/06/14 1413 02/06/14 2005 02/07/14 0124  TROPONINI <0.30 <0.30 <0.30   BNP: BNP (last 3 results)  Recent Labs  08/26/13 0452 01/28/14 0241 02/06/14 1005  PROBNP 3270.0* 33416.0* 34741.0*   CBG:  Recent Labs Lab 02/10/14 1126 02/10/14 1734 02/10/14 2127 02/11/14 0555 02/11/14 1131  GLUCAP 115* 152* 215* 150* 281*       Signed:  Aileen Fass, ABRAHAM  [facility] 02/11/2014, 2:11 PM   **Disclaimer: This note may have been dictated with voice recognition software. Similar sounding words can inadvertently be transcribed and this note may contain transcription errors which may not have been corrected upon publication of note.**

## 2014-02-15 NOTE — Progress Notes (Addendum)
Patient ID: Jon Burns, male   DOB: 06-01-1941, 73 y.o.   MRN: [id number] PCP: N/A Primary Cardiologist: Dr. Chuck Hint Pulmonologist: Dr. Sherene Sires Nephrologist [facility]: Dr. Lannie Fields and Forbes Cellar  HPI: 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 Last stress test at [facility] submaximal in that he did not reach adequate HR although no inducible wall motion abnormalities noted.   Ad[facility] with increased fatigue and generalized malaise. RVP also positive for PSV, but flu negative. Had blood cultures and crypto antigen drawn. Antigen came back positive 1:5, and he is admitted. CXR from 3/9 with reticulo-nodular opacities which could be from a cryptococcal infection. Fortunately his CSF was cryptococcus negative. 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 Per our transplant infectious disease team, the patient was started on fluconazole 400mg  PO daily for a prolonged course of up to a year  Over the past 6 months has been readmitted 3 times with SOB and A/C diastolic HF. Last admission 6/7-6/12/15 for SOB. Diuresed with IV lasix and discharge weight 188 lbs.   RHC 02/09/14: RA 7 RV 32/8 PA 32/20 PCWP 14 PA 63% Fick CO/CI: 6.01/2.82 PVR 1.9 WU  ECHO (01/2013 @ [facility]): EF >55% with severe concentric LVH and mild LAE             (01/2014): EF 25-30% with diff HK, grade II DD, LA mod dilated, RV sys fx mildly reduced   Post Hospital Follow up for Heart Failure: Reports since being home SOB is improved however he is just really fatigued. Was started on low dose coreg 3.125 mg BID, however was stopped d/t drop in SBP 70s. Weight on home scale 179 when he left hospital 186 lbs. Denies CP,  palpitations,   SH: Lives in [place] with wife.  FH: Mother deceased: cancer        Father deceased: no issues   ROS: All systems negative except as listed in HPI, PMH and Problem List.  Past Medical History  Diagnosis Date  . Hypertension   . CHF (congestive heart failure)   . Hypercholesteremia   . Heart murmur   . Pneumonia 2013  . Shortness of breath     "can come on at anytime lately; sometimes related to his CHF" (08/27/2013)  . Sleep apnea     "positively has it but never tested"/daughter @ bedside (08/27/2013)  . Type II diabetes mellitus   . GERD (gastroesophageal reflux disease)   . Gout   . Renal disorder     R kidney transplant 05/14/10    Current Outpatient Prescriptions  Medication Sig Dispense Refill  . allopurinol (ZYLOPRIM) 100 MG tablet Take 100 mg by mouth every morning.      Marland Kitchen aspirin EC 81 MG tablet Take 81 mg by mouth every Monday, Wednesday, and Friday.       . Calcium Carbonate-Vitamin D (CALTRATE 600+D PO) Take 1 tablet by mouth 2 (two) times daily.      . cyanocobalamin 1000 MCG tablet Take 1,000 mcg by mouth daily.      . finasteride (PROSCAR) 5 MG tablet Take 5 mg by mouth daily.      . fluconazole (DIFLUCAN) 200 MG tablet Take 400 mg by  mouth daily.       . furosemide (LASIX) 20 MG tablet Take 40 mg by mouth 2 (two) times daily.       Marland Kitchen glucagon 1 MG injection Inject 1 mg into the vein once as needed.  1 each  12  . HYDROcodone-acetaminophen (NORCO) 10-325 MG per tablet Take 1 tablet by mouth every 6 (six) hours as needed for moderate pain.       Marland Kitchen insulin NPH Human (HUMULIN N,NOVOLIN N) 100 UNIT/ML injection Inject 22 Units into the skin at bedtime.      . insulin regular (NOVOLIN R,HUMULIN R) 100 units/mL injection Inject 12 Units into the skin 3 (three) times daily before meals.      . magnesium oxide (MAG-OX) 400 MG tablet Take 400 mg by mouth daily.      Marland Kitchen omeprazole (PRILOSEC) 20 MG capsule Take 20 mg by mouth daily.      . pravastatin  (PRAVACHOL) 80 MG tablet Take 80 mg by mouth daily.      . predniSONE (DELTASONE) 5 MG tablet Take 2.5-5 mg by mouth 2 (two) times daily. Take 5 mg in the morning and 2.5 mg at bedtime      . tacrolimus (PROGRAF) 0.5 MG capsule Take 1 capsule (0.5 mg total) by mouth 2 (two) times daily.  30 capsule  0  . terazosin (HYTRIN) 10 MG capsule Take 10 mg by mouth every evening.       . traMADol (ULTRAM) 50 MG tablet Take by mouth every 6 (six) hours as needed for moderate pain.       . carvedilol (COREG) 3.125 MG tablet Take 1 tablet (3.125 mg total) by mouth 2 (two) times daily with a meal.  30 tablet  0   No current facility-administered medications for this encounter.    Filed Vitals:   02/16/14 1413  BP: 84/52  Pulse: 78  Resp: 16  Weight: 190 lb 2 oz (86.24 kg)  SpO2: 98%    PHYSICAL EXAM:  General:  Elderly appearing, fatigued, No resp difficulty, wife present HEENT: normal Neck: supple. JVP flat. Carotids 2+ bilaterally; no bruits. No lymphadenopathy or thryomegaly appreciated. Cor: PMI normal. Regular rate & rhythm. No rubs, gallops or murmurs. Lungs: clear Abdomen: soft, nontender, nondistended. No hepatosplenomegaly. No bruits or masses. Good bowel sounds. Extremities: no cyanosis, clubbing, rash, edema Neuro: alert & orientedx3, cranial nerves grossly intact. Moves all 4 extremities w/o difficulty. Affect pleasant.   ASSESSMENT & PLAN:  1) Chronic systolic/diastolic HF: EF 25-30% with grade II DD (01/2014) - Reviewed discharge summary and patient just discharged for A/C HF. During admission repeat ECHO showed severely depressed EF which is new for the patient. He had a RHC showing mildly elevated filling pressures and normal CO. - NYHA III symptoms and volume status dry. He was very orthostatic on exam and we gave 500 NS bolus with some improvement, however remained orthostatic and received another 500 cc (total 1 L). His weight is up on our scale from d/c weight of 188 lbs,  however wife reports his weight at discharge on their scale was 186 and now he is 179. Difficult situation because he keeps getting readmitted for volume overload. Will decrease diuretics to 40 mg in the am and 20 mg pm. - He is no longer on coreg with extreme fatigue will not restart and can reassess next visit. - He is not on any HF meds and remains on proscar 5 mg daily and terazosin 10 mg  daily for BPH which he has been on for years. Told patient to call clinic if SBP remains low to call and we will likely stop one of the meds. - Has not had ICM workup and cant have cath with CKD. Will undergo Lexiscan myoview next week - Reinforced the need and importance of daily weights, a low sodium diet, and fluid restriction (less than 2 L a day). Instructed to call the HF clinic if weight increases more than 3 lbs overnight or 5 lbs in a week.  2) CKD stage III/IV: [facility] - baseline Cr 2.2-2.7. Will check BMET today. Continue on prednisone and prograf per [facility]  3) HTN - as above orthostatic. Gave IL NS bolus and cut back on diuretics.   F/U 2 weeks Ulla Potashosgrove, Ali B NP-C 8:53 PM   Addendum: Labs reviewed and K 4.9, creatinine 1.82 and pro-BNP 25408. Very concerned about patient and his prognosis. On exam appeared dry and was orthostatic but Pro-BNP elevated. Think he is going to be a difficult patient to manage balancing his renal function and fluid status. May benefit from empiric milrinone since on cath he did not qualify for medication.

## 2014-02-16 ENCOUNTER — Encounter (HOSPITAL_COMMUNITY): Payer: Self-pay

## 2014-02-16 ENCOUNTER — Ambulatory Visit (HOSPITAL_COMMUNITY)
Admit: 2014-02-16 | Discharge: 2014-02-16 | Disposition: A | Discharge status: Home / Self Care | Payer: Medicare HMO | Attending: Internal Medicine | Admitting: Internal Medicine

## 2014-02-16 VITALS — BP 84/52 | HR 78 | Resp 16 | Wt 190.1 lb

## 2014-02-16 DIAGNOSIS — Z79899 Other long term (current) drug therapy: Secondary | ICD-10-CM | POA: Diagnosis not present

## 2014-02-16 DIAGNOSIS — N183 Chronic kidney disease, stage 3 unspecified: Secondary | ICD-10-CM | POA: Insufficient documentation

## 2014-02-16 DIAGNOSIS — I10 Essential (primary) hypertension: Secondary | ICD-10-CM

## 2014-02-16 DIAGNOSIS — I129 Hypertensive chronic kidney disease with stage 1 through stage 4 chronic kidney disease, or unspecified chronic kidney disease: Secondary | ICD-10-CM | POA: Insufficient documentation

## 2014-02-16 DIAGNOSIS — Z7982 Long term (current) use of aspirin: Secondary | ICD-10-CM | POA: Insufficient documentation

## 2014-02-16 DIAGNOSIS — E1129 Type 2 diabetes mellitus with other diabetic kidney complication: Secondary | ICD-10-CM | POA: Insufficient documentation

## 2014-02-16 DIAGNOSIS — K219 Gastro-esophageal reflux disease without esophagitis: Secondary | ICD-10-CM | POA: Diagnosis not present

## 2014-02-16 DIAGNOSIS — Z940 Kidney transplant status: Secondary | ICD-10-CM | POA: Insufficient documentation

## 2014-02-16 DIAGNOSIS — I5022 Chronic systolic (congestive) heart failure: Secondary | ICD-10-CM | POA: Diagnosis not present

## 2014-02-16 DIAGNOSIS — Z87891 Personal history of nicotine dependence: Secondary | ICD-10-CM | POA: Diagnosis not present

## 2014-02-16 DIAGNOSIS — R5381 Other malaise: Secondary | ICD-10-CM | POA: Insufficient documentation

## 2014-02-16 DIAGNOSIS — I5042 Chronic combined systolic (congestive) and diastolic (congestive) heart failure: Secondary | ICD-10-CM | POA: Insufficient documentation

## 2014-02-16 DIAGNOSIS — Z794 Long term (current) use of insulin: Secondary | ICD-10-CM | POA: Insufficient documentation

## 2014-02-16 DIAGNOSIS — N400 Enlarged prostate without lower urinary tract symptoms: Secondary | ICD-10-CM | POA: Diagnosis not present

## 2014-02-16 DIAGNOSIS — R5383 Other fatigue: Secondary | ICD-10-CM

## 2014-02-16 DIAGNOSIS — N184 Chronic kidney disease, stage 4 (severe): Secondary | ICD-10-CM | POA: Insufficient documentation

## 2014-02-16 DIAGNOSIS — I509 Heart failure, unspecified: Secondary | ICD-10-CM | POA: Insufficient documentation

## 2014-02-16 LAB — BASIC METABOLIC PANEL
BUN: 59 mg/dL — AB (ref 6–23)
CALCIUM: 7.8 mg/dL — AB (ref 8.4–10.5)
CHLORIDE: 96 meq/L (ref 96–112)
CO2: 24 mEq/L (ref 19–32)
CREATININE: 1.82 mg/dL — AB (ref 0.50–1.35)
GFR calc non Af Amer: 36 mL/min — ABNORMAL LOW (ref >90)
GFR, EST AFRICAN AMERICAN: 41 mL/min — AB (ref >90)
Glucose, Bld: 254 mg/dL — ABNORMAL HIGH (ref 70–99)
Potassium: 4.9 mEq/L (ref 3.7–5.3)
Sodium: 133 mEq/L — ABNORMAL LOW (ref 137–147)

## 2014-02-16 LAB — PRO B NATRIURETIC PEPTIDE: Pro B Natriuretic peptide (BNP): 25408 pg/mL — ABNORMAL HIGH (ref 0–125)

## 2014-02-16 MED ORDER — FUROSEMIDE 20 MG PO TABS: ORAL_TABLET | ORAL | Status: DC

## 2014-02-16 NOTE — Progress Notes (Signed)
Orthostatics Laying: 85/58 Sitting: 74/56 Standing: 62/48

## 2014-02-16 NOTE — Patient Instructions (Signed)
Decrease lasix to 40 mg (2 tablets) in the morning and 20 mg (1 tablet) in the evening. If weight starts trending up or he gains more than 3 lbs in a day call us.  Call if blood pressure remains low.  Follow up in 2 weeks  Do the following things EVERYDAY: 1) Weigh yourself in the morning before breakfast. Write it down and keep it in a log. 2) Take your medicines as prescribed 3) Eat low salt foods-Limit salt (sodium) to 2000 mg per day.  4) Stay as active as you can everyday 5) Limit all fluids for the day to less than 2 liters 6)

## 2014-02-17 ENCOUNTER — Telehealth (HOSPITAL_COMMUNITY): Payer: Self-pay | Admitting: Vascular Surgery

## 2014-02-17 NOTE — Telephone Encounter (Signed)
Spoke with pts wife states pt is doing a lot better since getting fluids, B/P is still low breakfast time bp= 75/46    Lunch time bp= 74/48   Afternoon time=76/47 Denies: dizziness, SOB or chest pain Based on last office note may need to d/c some meds Advised pt wife will review with NP and return her call ASAP

## 2014-02-17 NOTE — Telephone Encounter (Signed)
PT wife called She was instructed to call Karie Mainland if blood pressure stayed low 76/47 @1 :15 PM

## 2014-02-21 ENCOUNTER — Encounter (HOSPITAL_COMMUNITY): Payer: Non-veteran care

## 2014-02-21 NOTE — Telephone Encounter (Signed)
Returned patient and wifes call, SBP in low 100's now and feels this has self-resolved.  Instructed to return call if SBP drops.  Aware and agreeable. [address] Filter

## 2014-02-23 ENCOUNTER — Ambulatory Visit (HOSPITAL_COMMUNITY): Payer: Medicare HMO | Attending: Cardiology | Admitting: Radiology

## 2014-02-23 VITALS — BP 120/72 | HR 96 | Ht 73.0 in | Wt 196.0 lb

## 2014-02-23 DIAGNOSIS — R5381 Other malaise: Secondary | ICD-10-CM | POA: Diagnosis not present

## 2014-02-23 DIAGNOSIS — R0602 Shortness of breath: Secondary | ICD-10-CM | POA: Diagnosis not present

## 2014-02-23 DIAGNOSIS — R0989 Other specified symptoms and signs involving the circulatory and respiratory systems: Secondary | ICD-10-CM | POA: Insufficient documentation

## 2014-02-23 DIAGNOSIS — R42 Dizziness and giddiness: Secondary | ICD-10-CM | POA: Diagnosis present

## 2014-02-23 DIAGNOSIS — R0609 Other forms of dyspnea: Secondary | ICD-10-CM | POA: Insufficient documentation

## 2014-02-23 DIAGNOSIS — R5383 Other fatigue: Secondary | ICD-10-CM

## 2014-02-23 DIAGNOSIS — I5189 Other ill-defined heart diseases: Secondary | ICD-10-CM

## 2014-02-23 DIAGNOSIS — I519 Heart disease, unspecified: Secondary | ICD-10-CM

## 2014-02-23 MED ORDER — TECHNETIUM TC 99M SESTAMIBI GENERIC - CARDIOLITE
10.0000 | Freq: Once | INTRAVENOUS | Status: AC | PRN
  Administered 2014-02-23: 10 via INTRAVENOUS

## 2014-02-23 MED ORDER — REGADENOSON 0.4 MG/5ML IV SOLN
0.4000 mg | Freq: Once | INTRAVENOUS | Status: AC
  Administered 2014-02-23: 0.4 mg via INTRAVENOUS

## 2014-02-23 MED ORDER — TECHNETIUM TC 99M SESTAMIBI GENERIC - CARDIOLITE
30.0000 | Freq: Once | INTRAVENOUS | Status: AC | PRN
  Administered 2014-02-23: 30 via INTRAVENOUS

## 2014-02-23 NOTE — Progress Notes (Signed)
[name] SITE 3 NUCLEAR MED [address] [place], [place] [address] [phone]    Cardiology Nuclear Med Study  Jon Burns is a 73 y.o. male     MRN : [id number]     DOB: 14-Oct-1940  Procedure Date: 02/23/2014  Nuclear Med Background Indication for Stress Test:  Evaluation for Ischemia, [facility] 02-06-2014 Acute Heart Failure, and Decreased EF on recent Echo History: No prior known history of CAD, 02-07-2014 Echo: EF=25-30%, LVH Cardiac Risk Factors: Carotid Disease, History of Smoking, Hypertension, IDDM,and Lipids  Symptoms:  Dizziness, DOE, Fatigue and SOB   Nuclear Pre-Procedure Caffeine/Decaff Intake:  None NPO After: 5 pm   Lungs:  clear O2 Sat: 97% on room air. IV 0.9% NS with Angio Cath:  22g  IV Site: R Hand  IV Started by:  Bonnita Levan, RN  Chest Size (in):  46 Cup Size: n/a  Height: 6\' 1"  (1.854 m)  Weight:  196 lb (88.905 kg)  BMI:  Body mass index is 25.86 kg/(m^2). Tech Comments:  CBG @ 6:30 am =228, No insulin this am. BP/IV R arm only. CBG was 261 at 11:25am after Lexiscan. Irean Hong, RN.    Nuclear Med Study 1 or 2 day study: 1 day  Stress Test Type:  Eugenie Birks  Reading MD: N/A  Order Authorizing Provider:  Charlton Haws, MD  Resting Radionuclide: Technetium 23m Sestamibi  Resting Radionuclide Dose: 11.0 mCi   Stress Radionuclide:  Technetium 36m Sestamibi  Stress Radionuclide Dose: 33.0 mCi           Stress Protocol Rest HR: 96 Stress HR: 97  Rest BP: 120/72 Stress BP: 121/77  Exercise Time (min): n/a METS: n/a   Predicted Max HR: 149 bpm % Max HR: 65.1 bpm Rate Pressure Product: 34917   Dose of Adenosine (mg):  n/a Dose of Lexiscan: 0.4 mg  Dose of Atropine (mg): n/a Dose of Dobutamine: n/a mcg/kg/min (at max HR)  Stress Test Technologist: Irean Hong, RN  Nuclear Technologist:  Domenic Polite, CNMT     Rest Procedure:  Myocardial perfusion imaging was performed at rest 45 minutes following the intravenous  administration of Technetium 38m Sestamibi. Rest ECG: NSR-LVH  Stress Procedure:  The patient received IV Lexiscan 0.4 mg over 15-seconds.  Technetium 64m Sestamibi injected at 30-seconds.  The patient complained of lightheadedness with Lexiscan. Quantitative spect images were obtained after a 45 minute delay. Stress ECG: No significant ST segment change suggestive of ischemia.  QPS Raw Data Images:  Normal; no motion artifact; normal heart/lung ratio. Stress Images:  There is decreased uptake in the inferior wall. Rest Images:  There is decreased uptake in the inferior wall. Subtraction (SDS):  No evidence of ischemia. Transient Ischemic Dilatation (Normal <1.22):  1.02 Lung/Heart Ratio (Normal <0.45):  0.33  Quantitative Gated Spect Images QGS EDV:  253 ml  QGS ESV:  189 ml  Impression Exercise Capacity:  Lexiscan with no exercise. BP Response:  Normal blood pressure response. Clinical Symptoms:  No significant symptoms noted. ECG Impression:  No significant ECG changes with Lexiscan. Comparison with Prior Nuclear Study: No previous nuclear study performed  Overall Impression:  High risk stress nuclear study with fixed inferior defect likely due to bowel artifact in the setting of a dilated ventricle. No reversible ischemia.  LV Ejection Fraction: 25%.  LV Wall Motion:  Severe global hypokinesis  Chrystie Nose, MD, [facility] Board Certified in Nuclear Cardiology Attending Cardiologist [facility]

## 2014-02-28 ENCOUNTER — Ambulatory Visit (INDEPENDENT_AMBULATORY_CARE_PROVIDER_SITE_OTHER): Payer: Medicare HMO

## 2014-02-28 ENCOUNTER — Encounter: Payer: Self-pay | Admitting: Podiatry

## 2014-02-28 ENCOUNTER — Ambulatory Visit (INDEPENDENT_AMBULATORY_CARE_PROVIDER_SITE_OTHER): Payer: Medicare HMO | Admitting: Podiatry

## 2014-02-28 VITALS — BP 120/73 | HR 101 | Resp 17 | Ht 73.0 in | Wt 184.0 lb

## 2014-02-28 DIAGNOSIS — S92309A Fracture of unspecified metatarsal bone(s), unspecified foot, initial encounter for closed fracture: Secondary | ICD-10-CM

## 2014-02-28 DIAGNOSIS — B351 Tinea unguium: Secondary | ICD-10-CM

## 2014-03-01 ENCOUNTER — Ambulatory Visit (HOSPITAL_COMMUNITY)
Admission: RE | Admit: 2014-03-01 | Discharge: 2014-03-01 | Disposition: A | Discharge status: Home / Self Care | Payer: Non-veteran care | Source: Ambulatory Visit | Attending: Internal Medicine | Admitting: Internal Medicine

## 2014-03-01 ENCOUNTER — Encounter (HOSPITAL_COMMUNITY): Payer: Self-pay

## 2014-03-01 ENCOUNTER — Encounter: Payer: Self-pay | Admitting: Podiatry

## 2014-03-01 VITALS — BP 104/71 | HR 92 | Resp 18 | Wt 190.2 lb

## 2014-03-01 DIAGNOSIS — I5022 Chronic systolic (congestive) heart failure: Secondary | ICD-10-CM | POA: Diagnosis not present

## 2014-03-01 DIAGNOSIS — N184 Chronic kidney disease, stage 4 (severe): Secondary | ICD-10-CM | POA: Insufficient documentation

## 2014-03-01 NOTE — Progress Notes (Signed)
Patient ID: Jon Burns, male   DOB: [birth year], 73 y.o.   MRN: [id number] PCP: N/A Primary Cardiologist: Dr. Chuck Hint Pulmonologist: Dr. Sherene Sires Nephrologist [facility]: Dr. 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 Last stress test at [facility] submaximal in that he did not reach adequate HR although no inducible wall motion abnormalities noted.   Admitted to [facility] in 10/2013 with increased fatigue and generalized malaise. RVP also positive for PSV, but flu negative. Had blood cultures and cryptococcal antigen drawn. Antigen came back positive 1:5, and he was admitted. CXR from 3/9 with reticulo-nodular opacities which could be from a cryptococcal infection. Fortunately his CSF was cryptococcus negative. 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 Per the transplant infectious disease team, the patient was started on fluconazole 400 mg PO daily for a prolonged course of up to a year  Over the past 6 months has been readmitted 3 times with SOB and A/C diastolic HF. Last admission 6/7-6/12/15 for SOB. Diuresed with IV lasix and discharge weight 188 lbs.   RHC 02/09/14: RA 7 RV 32/8 PA 32/20 PCWP 14 PA 63% Fick CO/CI: 6.01/2.82 PVR 1.9 WU  ECHO (01/2013 @ [facility]): EF >55% with severe concentric LVH and mild LAE             (01/2014): EF 25-30% with diff HK, grade II DD, LA mod dilated, RV sys fx mildly reduced   Follow up for Heart Failure: At last appointment, he was orthostatic and was given IV fluid.  He was set up for [employer] given fall in EF on echo (avoiding cath with renal transplant and CKD).  This was done in 6/15 and showed EF 25% with a fixed inferior  defect, bowel artifact versus prior MI, no ischemia.  Currently, he says that he feels good in the morning but gets progressively worse as the day goes on.  Main complaint seems to be fatigued and weakness.  He can walk around his house without dyspnea but is short of breath walking a block or up a flight of steps.  He continues to be lightheaded with standing.  He has been on a high dose of terazosin along with finasteride for a long time.  He denies and problem with urination.  He actually thinks he was put on terazosin for BP control. No chest pain.    Labs (6/15): K 4.9, creatinine 1.82, BUN 59, BNP [phone]  SH: Lives in [place] with wife.  FH: Mother deceased: cancer        Father deceased: no issues   ROS: All systems negative except as listed in HPI, PMH and Problem List.  Past Medical History  Diagnosis Date  . Hypertension   . CHF (congestive heart failure)   . Hypercholesteremia   . Heart murmur   . Pneumonia 2013  . Shortness of breath     "can come on at anytime lately; sometimes related to his CHF" (08/27/2013)  . Sleep apnea     "positively has it but never tested"/daughter @ bedside (08/27/2013)  . Type II diabetes mellitus   . GERD (gastroesophageal reflux disease)   . Gout   . Renal disorder  R kidney transplant 05/14/10    Current Outpatient Prescriptions  Medication Sig Dispense Refill  . allopurinol (ZYLOPRIM) 100 MG tablet Take 100 mg by mouth every morning.      Marland Kitchen aspirin EC 81 MG tablet Take 81 mg by mouth every Monday, Wednesday, and Friday.       . Calcium Carbonate-Vitamin D (CALTRATE 600+D PO) Take 1 tablet by mouth 2 (two) times daily.      . cyanocobalamin 1000 MCG tablet Take 1,000 mcg by mouth daily.      . ferrous sulfate 325 (65 FE) MG tablet Take 325 mg by mouth daily with breakfast.      . finasteride (PROSCAR) 5 MG tablet Take 5 mg by mouth daily.      . fluconazole (DIFLUCAN) 200 MG tablet Take 400 mg by mouth daily.       . furosemide  (LASIX) 20 MG tablet 40 mg 2 (two) times daily. Take 40 mg (2 tablets) in the morning and 20 mg (1 tablet) in the evening.      Marland Kitchen glucagon 1 MG injection Inject 1 mg into the vein once as needed.  1 each  12  . HYDROcodone-acetaminophen (NORCO) 10-325 MG per tablet Take 1 tablet by mouth every 6 (six) hours as needed for moderate pain.       Marland Kitchen insulin NPH Human (HUMULIN N,NOVOLIN N) 100 UNIT/ML injection Inject 22 Units into the skin at bedtime.      . insulin regular (NOVOLIN R,HUMULIN R) 100 units/mL injection Inject 12 Units into the skin 3 (three) times daily before meals.      . magnesium oxide (MAG-OX) 400 MG tablet Take 400 mg by mouth daily.      Marland Kitchen omeprazole (PRILOSEC) 20 MG capsule Take 20 mg by mouth daily.      . pravastatin (PRAVACHOL) 80 MG tablet Take 80 mg by mouth daily.      . predniSONE (DELTASONE) 5 MG tablet Take 2.5-5 mg by mouth 2 (two) times daily. Take 5 mg in the morning and 2.5 mg at bedtime      . sirolimus (RAPAMUNE) 1 MG tablet Take 2 mg by mouth daily.      . traMADol (ULTRAM) 50 MG tablet Take by mouth every 6 (six) hours as needed for moderate pain.        No current facility-administered medications for this encounter.    Filed Vitals:   03/01/14 1447  BP: 104/71  Pulse: 92  Resp: 18  Weight: 190 lb 4 oz (86.297 kg)  SpO2: 96%    PHYSICAL EXAM:  General:  Elderly appearing, fatigued, No resp difficulty, wife present HEENT: normal Neck: supple. JVP flat. Carotids 2+ bilaterally; no bruits. No lymphadenopathy or thryomegaly appreciated. Cor: PMI normal. Regular rate & rhythm. No rubs, gallops or murmurs. Lungs: clear Abdomen: soft, nontender, nondistended. No hepatosplenomegaly. No bruits or masses. Good bowel sounds. Extremities: no cyanosis, clubbing, rash, edema Neuro: alert & orientedx3, cranial nerves grossly intact. Moves all 4 extremities w/o difficulty. Affect pleasant.   ASSESSMENT & PLAN:  1) Chronic systolic CHF: EF 07-62% with grade II  DD (01/2014)  Lexiscan Cardiolite in 6/15 showed EF 24% with fixed inferior defect (artifact versus infarction, no ischemia).  He is not volume overloaded on exam currently and weight is stable.  He has not been able to tolerate cardiac meds due to orthostatic hypotension.  BNP hard to interpret with CKD III-IV.  - No ischemia on Cardiolite, would avoid LHC  given renal transplant and CKD III-IV - Continue current Lasix, 40 qam/20 qpm.   - I would like to try him off terazosin completely given orthostatic symptoms and history of hypotension.  He is also on finasteride, so hopefully will not develop BPH symptoms (does not have at baseline).  If he has more BP room at followup, could consider trying Coreg again.  2) CKD stage III/IV: [facility] - [facility]   Dalton McLean 03/02/2014

## 2014-03-01 NOTE — Progress Notes (Signed)
Patient ID: Jon Burns, male   DOB: 1941/06/24, 73 y.o.   MRN: [id number]  Subjective:  Patient presents today for followup care for Jones fracture on the right foot diagnosed on the visit of 01/21/2014. He was initially placed in Cam Walker boot at that time. He is complaining about wearing the boot as it makes him. Unstable when he walks and a secure fold falling. He says he's not going to wear the boot any further because his foot is comfortable and not swollen. Is also complaining that his toenails are long and uncomfortable and is requesting debridement.  Objective: Orientated x3 space white male presents with his wife The right foot demonstrates no edema, erythema or any skin lesions. Palpation of the base of fifth right metatarsal elicits no tenderness or crepitus The toenails 6-10 are hypertrophic, elongated and discolored  X-ray examination weightbearing right foot  Jones fracture without bone callus noted  No increased soft tissue density  No emphysema  Radiographic impression:  Jones fracture right foot without evidence of bone callus   Assessment:  Jones fracture right foot clinically not demonstrating any bone callus on x-ray Symptomatic onychomycoses x10  Plan: Advised patient to continue wearing Cam Walker boot Patient refuses to wear Cam Walker boot he says he says lands  To wear his regular shoe. I advised him that this would further delay the healing of the fracture in the fifth right metatarsal, however, he still is refusing to wear the Cam Walker boot  I dispensed a flat surgical shoe for him to wear and he placed on his foot and he was satisfied and he said he would wear that when he was walking or standing.  All 10 toenails were debrided without a bleeding

## 2014-03-01 NOTE — Progress Notes (Signed)
Patient ID: Remer MachoChester Wion, male   DOB: 1940-09-21, 73 y.o.   MRN: [id number] PCP: N/A Primary Cardiologist: Dr. Chuck HintNishan/Dr. Koneswaran Pulmonologist: Dr. Sherene SiresWert Nephrologist [facility]: Dr. Lannie FieldsMatthew Ellis and Forbes CellarSanoff  HPI: 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 [facility] submaximal in that he did not reach adequate HR although no inducible wall motion abnormalities noted.   [facility] with increased fatigue and generalized malaise. RVP also positive for PSV, but flu negative. Had blood cultures and crypto antigen drawn. Antigen came back positive 1:5, and he is admitted. CXR from 3/9 with reticulo-nodular opacities which could be from a cryptococcal infection. Fortunately his CSF was cryptococcus negative. 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 Per our transplant infectious disease team, the patient was started on fluconazole 400mg  PO daily for a prolonged course of up to a year  Over the past 6 months has been readmitted 3 times with SOB and A/C diastolic HF. Last admission 6/7-6/12/15 for SOB. Diuresed with IV lasix and discharge weight 188 lbs.   RHC 02/09/14: RA 7 RV 32/8 PA 32/20 PCWP 14 PA 63% Fick CO/CI: 6.01/2.82 PVR 1.9 WU  ECHO (01/2013 @ [facility]): EF >55% with severe concentric LVH and mild LAE             (01/2014): EF 25-30% with diff HK, grade II DD, LA mod dilated, RV sys fx mildly reduced   Post Hospital Follow up for Heart Failure: Reports since being home SOB is improved however he is just really fatigued. Was started on low dose coreg 3.125 mg BID, however was stopped d/t drop in SBP 70s. Weight on home scale 179 when he left hospital 186 lbs. Denies CP,  palpitations,   [place].  FH: Mother deceased: cancer        Father deceased: no issues   ROS: All systems negative except as listed in HPI, PMH and Problem List.  Past Medical History  Diagnosis Date  . Hypertension   . CHF (congestive heart failure)   . Hypercholesteremia   . Heart murmur   . Pneumonia 2013  . Shortness of breath     "can come on at anytime lately; sometimes related to his CHF" (08/27/2013)  . Sleep apnea     "positively has it but never tested"/daughter @ bedside (08/27/2013)  . Type II diabetes mellitus   . GERD (gastroesophageal reflux disease)   . Gout   . Renal disorder     R kidney transplant 05/14/10    Current Outpatient Prescriptions  Medication Sig Dispense Refill  . allopurinol (ZYLOPRIM) 100 MG tablet Take 100 mg by mouth every morning.      Marland Kitchen. aspirin EC 81 MG tablet Take 81 mg by mouth every Monday, Wednesday, and Friday.       . Calcium Carbonate-Vitamin D (CALTRATE 600+D PO) Take 1 tablet by mouth 2 (two) times daily.      . cyanocobalamin 1000 MCG tablet Take 1,000 mcg by mouth daily.      . ferrous sulfate 325 (65 FE) MG tablet Take 325 mg by mouth daily with breakfast.      . finasteride (PROSCAR) 5 MG tablet  Take 5 mg by mouth daily.      . fluconazole (DIFLUCAN) 200 MG tablet Take 400 mg by mouth daily.       . furosemide (LASIX) 20 MG tablet 40 mg 2 (two) times daily. Take 40 mg (2 tablets) in the morning and 20 mg (1 tablet) in the evening.      Marland Kitchen glucagon 1 MG injection Inject 1 mg into the vein once as needed.  1 each  12  . HYDROcodone-acetaminophen (NORCO) 10-325 MG per tablet Take 1 tablet by mouth every 6 (six) hours as needed for moderate pain.       Marland Kitchen insulin NPH Human (HUMULIN N,NOVOLIN N) 100 UNIT/ML injection Inject 22 Units into the skin at bedtime.      . insulin regular (NOVOLIN R,HUMULIN R) 100 units/mL injection Inject 12 Units into the skin 3 (three) times daily before meals.      . magnesium  oxide (MAG-OX) 400 MG tablet Take 400 mg by mouth daily.      Marland Kitchen omeprazole (PRILOSEC) 20 MG capsule Take 20 mg by mouth daily.      . pravastatin (PRAVACHOL) 80 MG tablet Take 80 mg by mouth daily.      . predniSONE (DELTASONE) 5 MG tablet Take 2.5-5 mg by mouth 2 (two) times daily. Take 5 mg in the morning and 2.5 mg at bedtime      . sirolimus (RAPAMUNE) 1 MG tablet Take 2 mg by mouth daily.      Marland Kitchen terazosin (HYTRIN) 10 MG capsule Take 10 mg by mouth every evening.       . traMADol (ULTRAM) 50 MG tablet Take by mouth every 6 (six) hours as needed for moderate pain.        No current facility-administered medications for this encounter.    Filed Vitals:   03/01/14 1447  BP: 104/71  Pulse: 92  Resp: 18  Weight: 190 lb 4 oz (86.297 kg)  SpO2: 96%    PHYSICAL EXAM:  General:  Elderly appearing, fatigued, No resp difficulty, wife present HEENT: normal Neck: supple. JVP flat. Carotids 2+ bilaterally; no bruits. No lymphadenopathy or thryomegaly appreciated. Cor: PMI normal. Regular rate & rhythm. No rubs, gallops or murmurs. Lungs: clear Abdomen: soft, nontender, nondistended. No hepatosplenomegaly. No bruits or masses. Good bowel sounds. Extremities: no cyanosis, clubbing, rash, edema Neuro: alert & orientedx3, cranial nerves grossly intact. Moves all 4 extremities w/o difficulty. Affect pleasant.   ASSESSMENT & PLAN:  1) Chronic systolic/diastolic HF: EF 25-30% with grade II DD (01/2014) - Reviewed discharge summary and patient just discharged for A/C HF. During admission repeat ECHO showed severely depressed EF which is new for the patient. He had a RHC showing mildly elevated filling pressures and normal CO. - NYHA III symptoms and volume status dry. He was very orthostatic on exam and we gave 500 NS bolus with some improvement, however remained orthostatic and received another 500 cc (total 1 L). His weight is up on our scale from d/c weight of 188 lbs, however wife reports his  weight at discharge on their scale was 186 and now he is 179. Difficult situation because he keeps getting readmitted for volume overload. Will decrease diuretics to 40 mg in the am and 20 mg pm. - He is no longer on coreg with extreme fatigue will not restart and can reassess next visit. - He is not on any HF meds and remains on proscar 5 mg daily and terazosin 10 mg daily  for BPH which he has been on for years. Told patient to call clinic if SBP remains low to call and we will likely stop one of the meds. - Has undergo Lexiscan myoview with no ischemia  - Reinforced the need and importance of daily weights, a low sodium diet, and fluid restriction (less than 2 L a day). Instructed to call the HF clinic if weight increases more than 3 lbs overnight or 5 lbs in a week.  2) CKD stage III/IV: s/p renal transplant at [facility] 2011 - baseline Cr 2.2-2.7. Will check BMET today. Continue on prednisone and prograf per [facility]  3) HTN - as above orthostatic. Gave IL NS bolus and cut back on diuretics.   F/U 2 weeks CLEGG,AMY NP-C 3:07 PM

## 2014-03-01 NOTE — Patient Instructions (Addendum)
Stop terazosin (Hytrin)  Follow up with [facility] in 2 weeks with Dr. Shirlee Latch  Do the following things EVERYDAY: 1) Weigh yourself in the morning before breakfast. Write it down and keep it in a log. 2) Take your medicines as prescribed 3) Eat low salt foods-Limit salt (sodium) to 2000 mg per day.  4) Stay as active as you can everyday 5) Limit all fluids for the day to less than 2 liters

## 2014-03-14 ENCOUNTER — Encounter (HOSPITAL_COMMUNITY): Payer: Non-veteran care

## 2014-03-21 ENCOUNTER — Ambulatory Visit: Payer: Non-veteran care | Admitting: Endocrinology

## 2014-03-22 ENCOUNTER — Other Ambulatory Visit: Payer: Self-pay | Admitting: Internal Medicine

## 2014-03-22 DIAGNOSIS — R911 Solitary pulmonary nodule: Secondary | ICD-10-CM

## 2014-03-28 ENCOUNTER — Ambulatory Visit: Payer: Medicare HMO | Admitting: Podiatry

## 2014-04-11 ENCOUNTER — Ambulatory Visit: Payer: Medicare HMO | Admitting: Podiatry

## 2014-04-12 ENCOUNTER — Other Ambulatory Visit: Payer: Non-veteran care

## 2014-04-12 ENCOUNTER — Ambulatory Visit: Payer: Non-veteran care | Admitting: Internal Medicine

## 2014-04-25 ENCOUNTER — Ambulatory Visit: Payer: Non-veteran care | Admitting: Cardiovascular Disease

## 2014-04-27 ENCOUNTER — Ambulatory Visit: Payer: Non-veteran care | Admitting: Cardiovascular Disease

## 2014-05-03 DEATH — deceased

## 2014-08-11 ENCOUNTER — Encounter (HOSPITAL_COMMUNITY): Payer: Self-pay | Admitting: Cardiovascular Disease

## 2015-12-23 IMAGING — CT CT CHEST W/O CM
2 of 4 series · 15 of 36 positions shown, 18 images · IV contrast (Omnipaque 300)
Comparison: No available relevant priors.

ADDENDUM:
Comparison with CT neck 06/28/2013 shows the left thyroid nodule to
be grossly unchanged. The 9 x 10 mm nodule in the left upper lobe is
likely stable (previously measuring 8 x 13 mm). The 7 x 11 mm nodule
seen on today's exam was not visualized on the prior study. Please
refer to the original report for follow up recommendations.
CLINICAL DATA: Followup pulmonary nodule.

EXAM:
CT CHEST WITHOUT CONTRAST
TECHNIQUE: Multidetector CT imaging of the chest was performed following the
standard protocol without IV contrast.

[Series 2: chest routine with · axial · 0.86mm/px · z∈[-276,-16]mm · 12 of 62 slices shown, 15 images]
[im 5/62  mediastinal]
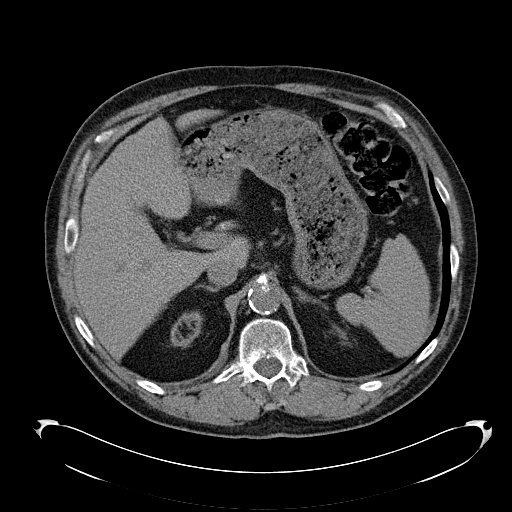
[im 5/62  lung]
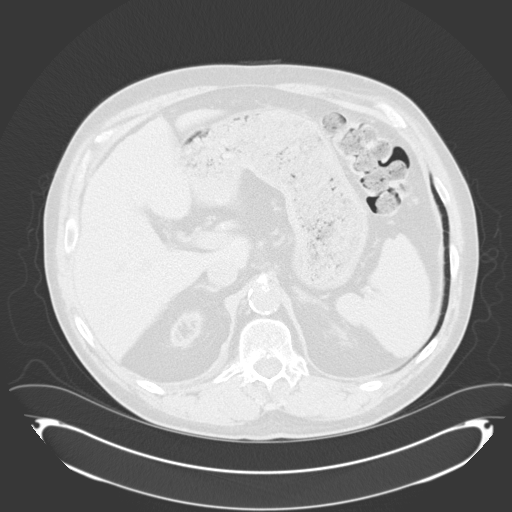
[im 10/62  lung]
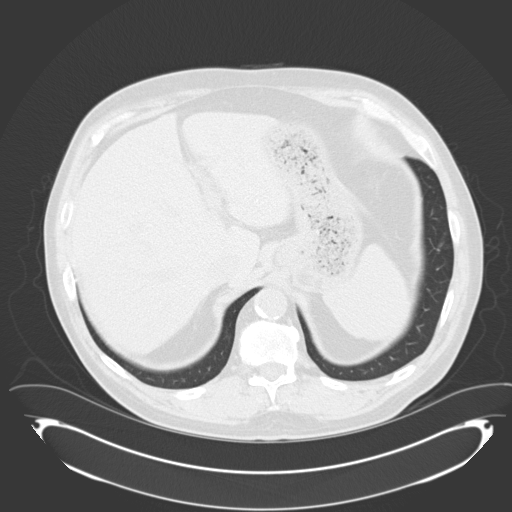
[im 15/62  lung]
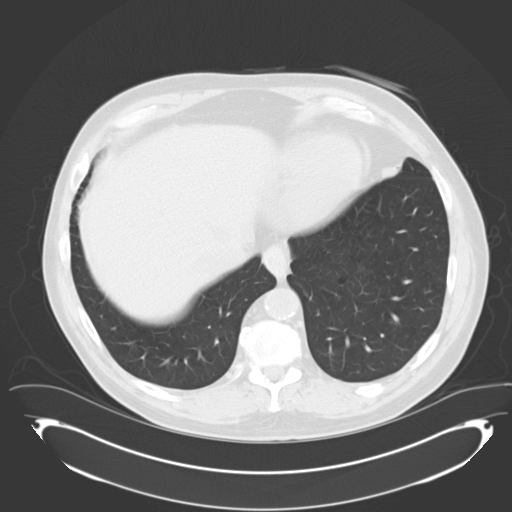
[im 19/62  lung]
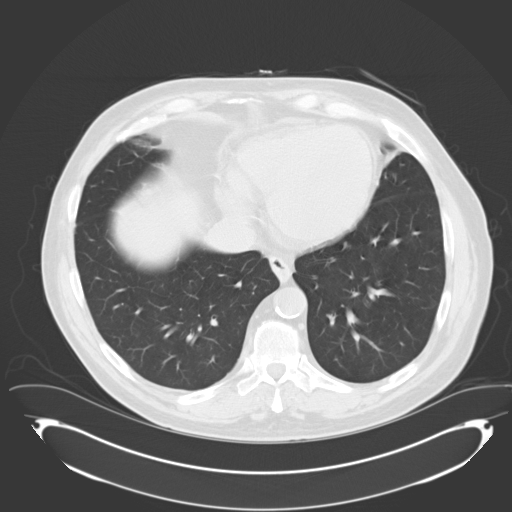
[im 24/62  mediastinal]
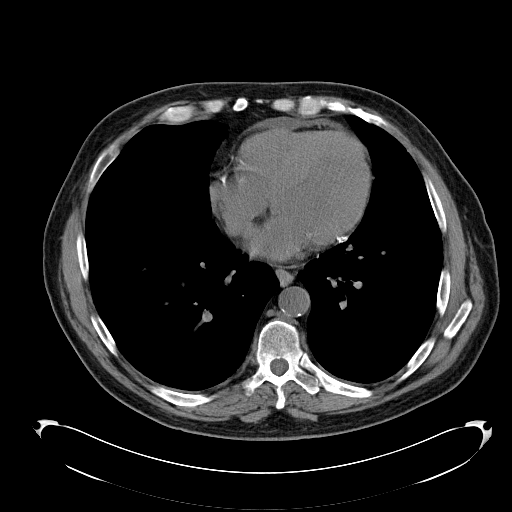
[im 24/62  lung]
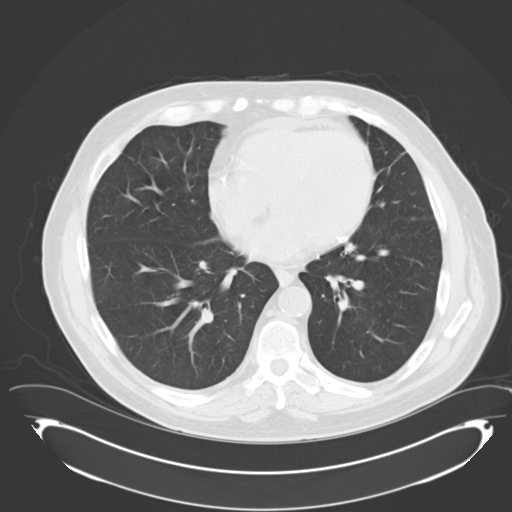
[im 29/62  lung]
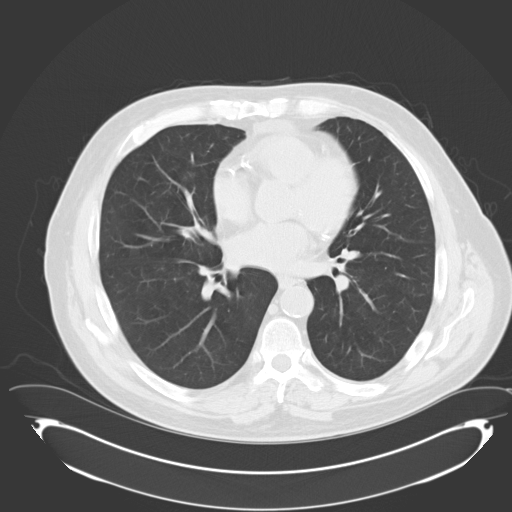
[im 33/62  lung]
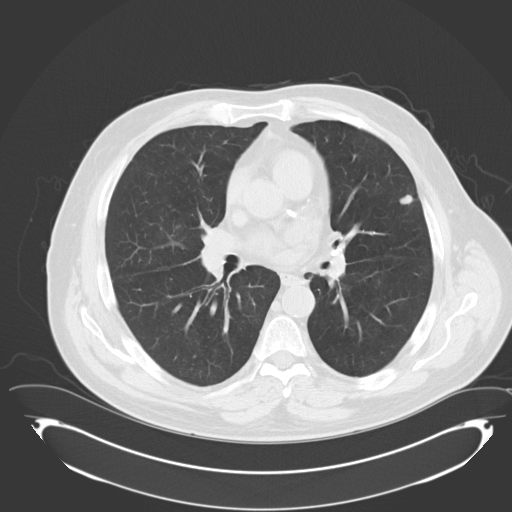
[im 38/62  lung]
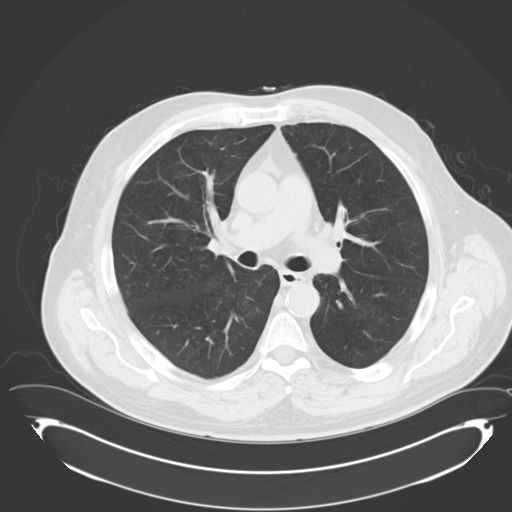
[im 43/62  mediastinal]
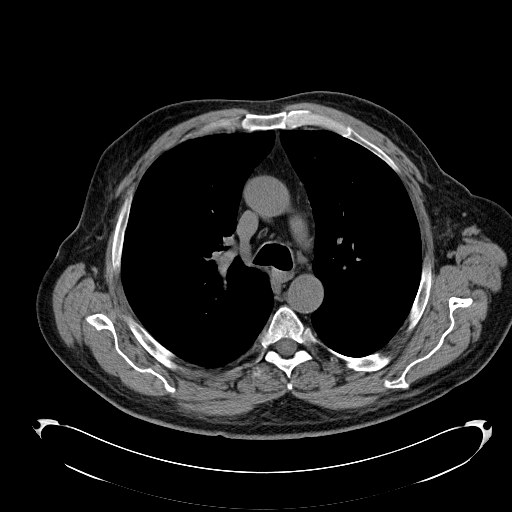
[im 43/62  lung]
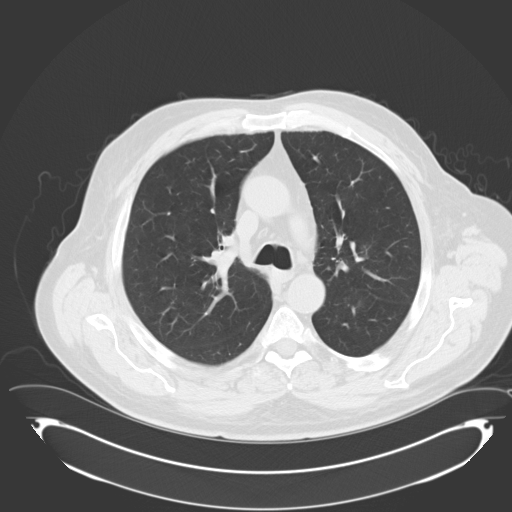
[im 47/62  lung]
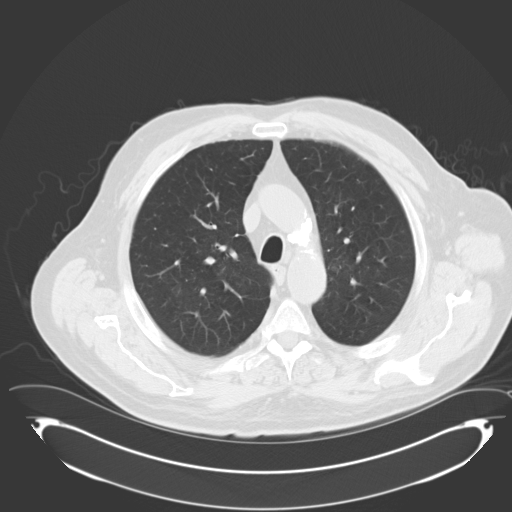
[im 52/62  lung]
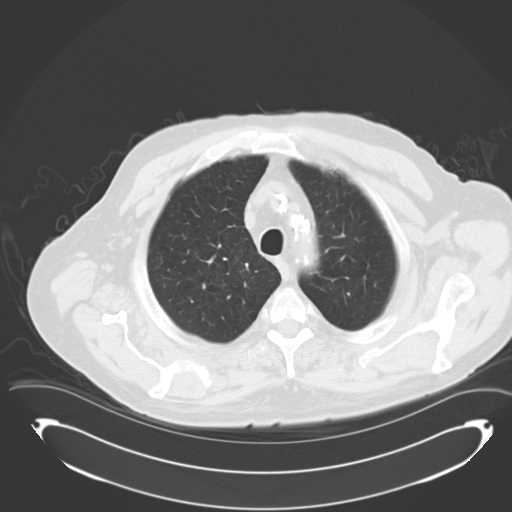
[im 57/62  lung]
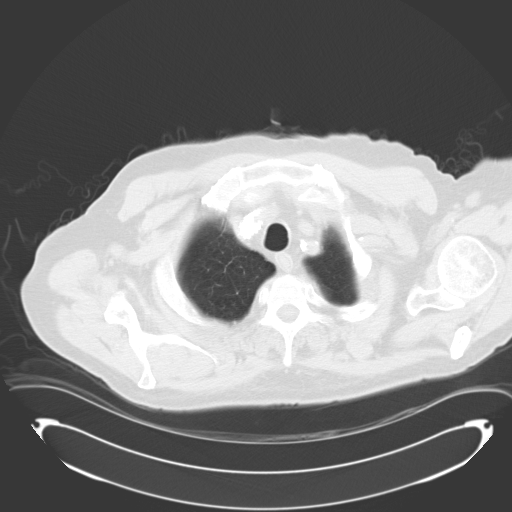

[Series 602: cor · coronal · 0.86mm/px · 3 of 137 slices shown]
[im 28/137  lung]
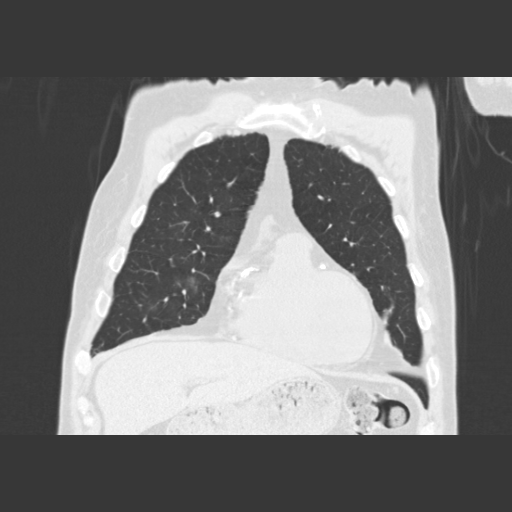
[im 55/137  lung]
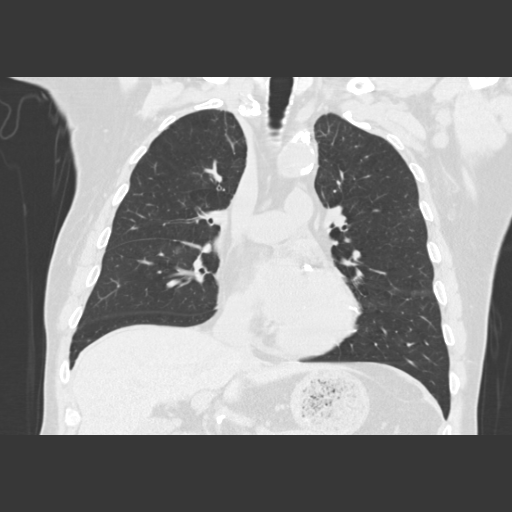
[im 82/137  lung]
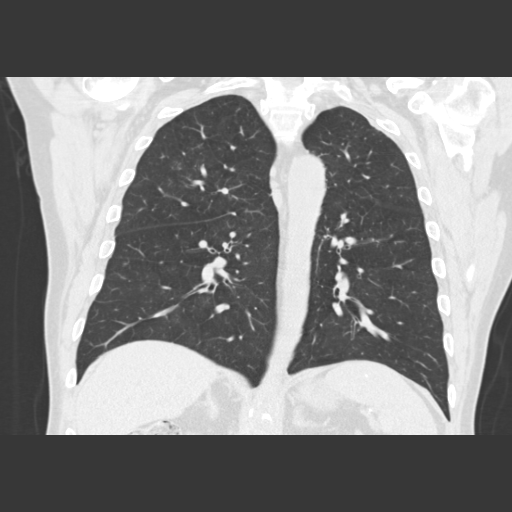

[15 of 36 positions shown; findings below may reference images not displayed]

FINDINGS: Low-attenuation lesion in the left lobe of the thyroid measures
cm. No pathologically enlarged mediastinal or axillary lymph nodes.
Calcified left hilar lymph nodes. Atherosclerotic calcification of
the arterial vasculature with extensive involvement of the coronary
arteries. Heart size normal. Small amount of anterior pericardial
fluid or thickening.

Centrilobular emphysema. Scattered calcified granulomas.
Noncalcified nodules in the left upper lobe measure 9 x 10 mm (image
23) and 7 x 11 mm (image 30). No pleural fluid. Airway is
unremarkable.

Incidental imaging of the upper abdomen shows the liver margin to be
mildly irregular. Visualized portions of liver, gallbladder and
adrenal glands are otherwise unremarkable. Native kidneys are
atrophic in incompletely imaged. Visualized portions of the spleen,
stomach and bowel are grossly unremarkable. No upper abdominal
adenopathy.

No worrisome lytic or sclerotic lesions.
IMPRESSION: 1. Left upper lobe pulmonary nodules. Comparison with previous CT
chest exams would be helpful. In the absence of prior chest CT
exams, followup CT chest without contrast is recommended in 3 months
in further initial evaluation. This recommendation follows the
consensus statement: Guidelines for Management of Small Pulmonary
Nodules Detected on CT Scans: A Statement from the [employer]
2. Low-attenuation lesion in the left lobe of the thyroid. Consider
further evaluation with thyroid ultrasound. If patient is clinically
hyperthyroid, consider nuclear medicine thyroid uptake and scan.
3. Extensive coronary artery calcification.
4. Marginal irregularity of the liver is indicative of cirrhosis.
